# Patient Record
Sex: Male | Born: 1985 | Race: White | Hispanic: No | Marital: Single | Smoking: Never smoker
Health system: Southern US, Community
[De-identification: ages and names within clinical notes are randomized; demographics above are authoritative.]

## PROBLEM LIST (undated history)

## (undated) DIAGNOSIS — K635 Polyp of colon: Secondary | ICD-10-CM

## (undated) DIAGNOSIS — R39198 Other difficulties with micturition: Secondary | ICD-10-CM

## (undated) DIAGNOSIS — K219 Gastro-esophageal reflux disease without esophagitis: Secondary | ICD-10-CM

## (undated) DIAGNOSIS — T4145XA Adverse effect of unspecified anesthetic, initial encounter: Secondary | ICD-10-CM

## (undated) DIAGNOSIS — F988 Other specified behavioral and emotional disorders with onset usually occurring in childhood and adolescence: Secondary | ICD-10-CM

## (undated) DIAGNOSIS — R110 Nausea: Secondary | ICD-10-CM

## (undated) DIAGNOSIS — T8859XA Other complications of anesthesia, initial encounter: Secondary | ICD-10-CM

## (undated) DIAGNOSIS — R5383 Other fatigue: Secondary | ICD-10-CM

## (undated) DIAGNOSIS — R531 Weakness: Secondary | ICD-10-CM

## (undated) HISTORY — DX: Other fatigue: R53.83

## (undated) HISTORY — DX: Nausea: R11.0

## (undated) HISTORY — DX: Polyp of colon: K63.5

## (undated) HISTORY — DX: Weakness: R53.1

## (undated) HISTORY — DX: Gastro-esophageal reflux disease without esophagitis: K21.9

## (undated) HISTORY — DX: Other specified behavioral and emotional disorders with onset usually occurring in childhood and adolescence: F98.8

## (undated) HISTORY — PX: UPPER GASTROINTESTINAL ENDOSCOPY: SHX188

## (undated) HISTORY — DX: Other difficulties with micturition: R39.198

---
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
## 2007-04-03 ENCOUNTER — Ambulatory Visit: Payer: Self-pay | Admitting: Family Medicine

## 2007-10-02 ENCOUNTER — Ambulatory Visit: Payer: Self-pay | Admitting: Family Medicine

## 2007-10-14 ENCOUNTER — Ambulatory Visit: Payer: Self-pay | Admitting: Family Medicine

## 2008-06-21 ENCOUNTER — Ambulatory Visit: Payer: Self-pay | Admitting: Family Medicine

## 2008-06-23 ENCOUNTER — Encounter: Admission: RE | Admit: 2008-06-23 | Discharge: 2008-06-23 | Payer: Self-pay | Admitting: Family Medicine

## 2008-06-30 ENCOUNTER — Encounter: Admission: RE | Admit: 2008-06-30 | Discharge: 2008-06-30 | Payer: Self-pay | Admitting: Family Medicine

## 2008-07-21 ENCOUNTER — Ambulatory Visit (HOSPITAL_COMMUNITY): Admission: RE | Admit: 2008-07-21 | Discharge: 2008-07-21 | Payer: Self-pay | Admitting: Gastroenterology

## 2008-11-22 ENCOUNTER — Ambulatory Visit: Payer: Self-pay | Admitting: Family Medicine

## 2009-01-06 ENCOUNTER — Ambulatory Visit: Payer: Self-pay | Admitting: Family Medicine

## 2009-08-25 ENCOUNTER — Emergency Department (HOSPITAL_COMMUNITY): Admission: EM | Admit: 2009-08-25 | Discharge: 2009-08-25 | Payer: Self-pay | Admitting: Emergency Medicine

## 2009-09-20 ENCOUNTER — Ambulatory Visit: Payer: Self-pay | Admitting: Family Medicine

## 2010-02-21 ENCOUNTER — Ambulatory Visit (HOSPITAL_COMMUNITY): Admission: RE | Admit: 2010-02-21 | Discharge: 2010-02-21 | Payer: Self-pay | Admitting: Orthopedic Surgery

## 2010-06-04 HISTORY — PX: CHOLECYSTECTOMY: SHX55

## 2011-01-25 ENCOUNTER — Other Ambulatory Visit (HOSPITAL_COMMUNITY): Payer: Self-pay | Admitting: Gastroenterology

## 2011-01-25 DIAGNOSIS — R109 Unspecified abdominal pain: Secondary | ICD-10-CM

## 2011-01-25 DIAGNOSIS — R110 Nausea: Secondary | ICD-10-CM

## 2011-02-08 ENCOUNTER — Encounter: Payer: Self-pay | Admitting: Family Medicine

## 2011-02-14 ENCOUNTER — Encounter (HOSPITAL_COMMUNITY)
Admission: RE | Admit: 2011-02-14 | Discharge: 2011-02-14 | Disposition: A | Discharge status: Home / Self Care | Payer: BC Managed Care – PPO | Source: Ambulatory Visit | Attending: Gastroenterology | Admitting: Gastroenterology

## 2011-02-14 ENCOUNTER — Encounter (HOSPITAL_COMMUNITY): Payer: Self-pay

## 2011-02-14 DIAGNOSIS — R1011 Right upper quadrant pain: Secondary | ICD-10-CM | POA: Insufficient documentation

## 2011-02-14 DIAGNOSIS — R109 Unspecified abdominal pain: Secondary | ICD-10-CM

## 2011-02-14 DIAGNOSIS — R110 Nausea: Secondary | ICD-10-CM

## 2011-02-14 MED ORDER — TECHNETIUM TC 99M MEBROFENIN IV KIT
5.4000 | PACK | Freq: Once | INTRAVENOUS | Status: AC | PRN
  Administered 2011-02-14: 5.4 via INTRAVENOUS

## 2011-03-20 ENCOUNTER — Encounter (INDEPENDENT_AMBULATORY_CARE_PROVIDER_SITE_OTHER): Payer: Self-pay | Admitting: General Surgery

## 2011-03-20 ENCOUNTER — Ambulatory Visit (INDEPENDENT_AMBULATORY_CARE_PROVIDER_SITE_OTHER): Payer: BC Managed Care – PPO | Admitting: General Surgery

## 2011-03-20 ENCOUNTER — Other Ambulatory Visit (INDEPENDENT_AMBULATORY_CARE_PROVIDER_SITE_OTHER): Payer: Self-pay | Admitting: General Surgery

## 2011-03-20 VITALS — BP 120/70 | HR 60 | Temp 97.9°F | Resp 20 | Ht 78.0 in | Wt 233.5 lb

## 2011-03-20 DIAGNOSIS — R1011 Right upper quadrant pain: Secondary | ICD-10-CM

## 2011-03-20 DIAGNOSIS — R112 Nausea with vomiting, unspecified: Secondary | ICD-10-CM

## 2011-03-20 NOTE — Progress Notes (Signed)
Chief Complaint  Patient presents with  . Other    new pt eval of RUQ pain with nausea possibly GB     HPI Luis Walton is a 25 y.o. male.   HPI He is referred by Dr. Loreta Walton for evaluation of right upper quadrant pain and nausea. These symptoms have been present off and on for 5 years. They are getting worse. He has had extensive evaluations for his symptoms in the past. He has seen multiple physicians. Recently he saw Dr. Loreta Walton and an abdominal ultrasound was obtained. This demonstrated normal gallbladder with no gallstones. A nuclear medicine hepatobiliary scan was obtained and demonstrated a normal  gallbladder function with ejection fraction of 71.4% (normal being greater than 30%).  A previous upper endoscopy was unremarkable. The symptoms do not seem to be related to food. They did not seem to be related to specific activities. He tried multiple medications without significant relief. He is here with his mother to discuss possible gallbladder surgery.  Past Medical History  Diagnosis Date  . ADD (attention deficit disorder)   . Colonic polyp   . Abdominal pain   . GERD (gastroesophageal reflux disease)   . Nausea   . Difficulty urinating   . Fatigue   . Weakness generalized     aching in joints and weakness after syncope     Past Surgical History  Procedure Date  . Upper gastrointestinal endoscopy     Family History  Problem Relation Age of Onset  . Asthma Brother   . Asthma Maternal Uncle   . Asthma Paternal Aunt   . Heart disease Paternal Grandmother   . Heart disease Paternal Grandfather     Social History History  Substance Use Topics  . Smoking status: Never Smoker   . Smokeless tobacco: Never Used  . Alcohol Use: No    No Known Allergies  No current outpatient prescriptions on file.    Review of Systems Review of Systems  Constitutional: Negative for fever and chills.  Respiratory: Negative.   Cardiovascular: Negative.   Gastrointestinal: Positive  for abdominal pain.  Genitourinary: Positive for difficulty urinating.  Hematological: Negative.     Blood pressure 120/70, pulse 60, temperature 97.9 F (36.6 C), resp. rate 20, height 6\' 6"  (1.981 m), weight 233 lb 8 oz (105.915 kg).  Physical Exam Physical Exam  Constitutional: He appears well-developed and well-nourished. No distress.  HENT:  Head: Normocephalic and atraumatic.  Eyes: Conjunctivae and EOM are normal.  Abdominal: Soft. Bowel sounds are normal. He exhibits no distension and no mass. There is tenderness (mild in RUQ). There is no guarding.  Musculoskeletal: Normal range of motion. He exhibits no edema.  Skin: Skin is warm and dry.       No jaundice.    Data Reviewed Notes from Dr. Kenna Walton office.  Assessment      Right upper quadrant pain with nausea. No obvious exacerbating factors. Symptoms are getting progressively worse. Upper endoscopy is unremarkable. Objective gallbladder studies are within normal limits. No evidence of irritable bowel syndrome.  Despite normal gallbladder studies, his symptoms are somewhat suggestive of gallbladder disease. Of note is that he has not had a gastric emptying scan.  Plan    Obtain gastric emptying scan. If this is normal then we discussed the possibility of laparoscopic cholecystectomy.  I explained to him and his mother that I could not tell them if it was going to definitely help his symptoms or not.  The operation could be  diagnostic in that he may still have his symptoms after the gallbladder removed and thus it is not his gallbladder causing symptoms. The operation could possibly be diagnostic and therapeutic in that his symptoms would improve.  I have explained the procedure, risks, and aftercare of cholecystectomy.  Risks include but are not limited to bleeding, infection, wound problems, anesthesia, diarrhea, bile leak, injury to common bile duct/liver/intestine.  He seems to understand.  As for now, we will await the  results of the gastric emptying scan. I will discuss those results with him and further treatment as well.        Luis Walton J 03/20/2011, 10:33 PM

## 2011-03-20 NOTE — Patient Instructions (Signed)
We will call you with the results of your gastric emptying scan.

## 2011-04-04 ENCOUNTER — Encounter (INDEPENDENT_AMBULATORY_CARE_PROVIDER_SITE_OTHER): Payer: Self-pay

## 2011-04-04 ENCOUNTER — Encounter (HOSPITAL_COMMUNITY)
Admission: RE | Admit: 2011-04-04 | Discharge: 2011-04-04 | Disposition: A | Discharge status: Home / Self Care | Payer: BC Managed Care – PPO | Source: Ambulatory Visit | Attending: General Surgery | Admitting: General Surgery

## 2011-04-04 DIAGNOSIS — R112 Nausea with vomiting, unspecified: Secondary | ICD-10-CM

## 2011-04-04 DIAGNOSIS — R1011 Right upper quadrant pain: Secondary | ICD-10-CM

## 2011-04-04 MED ORDER — TECHNETIUM TC 99M SULFUR COLLOID
2.0000 | Freq: Once | INTRAVENOUS | Status: AC | PRN
  Administered 2011-04-04: 2 via ORAL

## 2011-04-05 ENCOUNTER — Telehealth (INDEPENDENT_AMBULATORY_CARE_PROVIDER_SITE_OTHER): Payer: Self-pay | Admitting: General Surgery

## 2011-04-05 NOTE — Telephone Encounter (Signed)
Tried to call Luis Walton, but was unable to reach him.  I did speak with his mother. His gastric emptying scan is normal.  They are interested in proceeding with cholecystectomy and we will work on getting this scheduled.

## 2011-04-12 ENCOUNTER — Other Ambulatory Visit (INDEPENDENT_AMBULATORY_CARE_PROVIDER_SITE_OTHER): Payer: Self-pay | Admitting: General Surgery

## 2011-04-16 ENCOUNTER — Other Ambulatory Visit (INDEPENDENT_AMBULATORY_CARE_PROVIDER_SITE_OTHER): Payer: Self-pay | Admitting: General Surgery

## 2011-04-27 ENCOUNTER — Encounter (HOSPITAL_COMMUNITY): Payer: Self-pay

## 2011-04-30 ENCOUNTER — Encounter (HOSPITAL_COMMUNITY): Payer: Self-pay

## 2011-04-30 ENCOUNTER — Encounter (HOSPITAL_COMMUNITY)
Admission: RE | Admit: 2011-04-30 | Discharge: 2011-04-30 | Disposition: A | Discharge status: Home / Self Care | Payer: BC Managed Care – PPO | Source: Ambulatory Visit | Attending: General Surgery | Admitting: General Surgery

## 2011-04-30 LAB — COMPREHENSIVE METABOLIC PANEL
ALT: 26 U/L (ref 0–53)
AST: 18 U/L (ref 0–37)
Alkaline Phosphatase: 86 U/L (ref 39–117)
CO2: 29 mEq/L (ref 19–32)
Calcium: 10 mg/dL (ref 8.4–10.5)
Chloride: 104 mEq/L (ref 96–112)
GFR calc Af Amer: >90 mL/min (ref >90)
GFR calc non Af Amer: >90 mL/min (ref >90)
Glucose, Bld: 99 mg/dL (ref 70–99)
Sodium: 143 mEq/L (ref 135–145)
Total Bilirubin: 0.4 mg/dL (ref 0.3–1.2)

## 2011-04-30 LAB — CBC
Hemoglobin: 17.2 g/dL — ABNORMAL HIGH (ref 13.0–17.0)
MCH: 31.5 pg (ref 26.0–34.0)
Platelets: 123 10*3/uL — ABNORMAL LOW (ref 150–400)
RBC: 5.46 MIL/uL (ref 4.22–5.81)
WBC: 8.3 10*3/uL (ref 4.0–10.5)

## 2011-04-30 LAB — SURGICAL PCR SCREEN: MRSA, PCR: NEGATIVE

## 2011-04-30 LAB — DIFFERENTIAL
Basophils Absolute: 0.1 10*3/uL (ref 0.0–0.1)
Basophils Relative: 1 % (ref 0–1)
Eosinophils Absolute: 0.3 10*3/uL (ref 0.0–0.7)
Eosinophils Relative: 3 % (ref 0–5)
Lymphocytes Relative: 29 % (ref 12–46)
Monocytes Absolute: 0.7 10*3/uL (ref 0.1–1.0)

## 2011-04-30 NOTE — Pre-Procedure Instructions (Signed)
22 Luis Walton  04/30/2011   Your procedure is scheduled on: 05/04/11  Report to [id number] AM.  Call this number if you have problems the morning of surgery: [phone]   Remember:   Do not eat food:After Midnight.  May have clear liquids: up to 4 Hours before arrival.  Clear liquids include soda, tea, black coffee, apple or grape juice, broth.  Take these medicines the morning of surgery with A SIP OF WATER: none              Discontinue any asa, blood thinners, herbal meds 5 days prior to surgery  Do not wear jewelry, make-up or nail polish.  Do not wear lotions, powders, or perfumes. You may wear deodorant.  Do not shave 48 hours prior to surgery.  Do not bring valuables to the hospital.  Contacts, dentures or bridgework may not be worn into surgery.  Leave suitcase in the car. After surgery it may be brought to your room.  For patients admitted to the hospital, checkout time is 11:00 AM the day of discharge.   Patients discharged the day of surgery will not be allowed to drive home.  Name and phone number of your driver:mom [name]  [phone]  Special Instructions: CHG Shower Use Special Wash: 1/2 bottle night before surgery and 1/2 bottle morning of surgery.   Please read over the following fact sheets that you were given: Pain Booklet, Coughing and Deep Breathing, MRSA Information and Surgical Site Infection Prevention

## 2011-04-30 NOTE — Progress Notes (Signed)
Chart reviewed. Left for allison zelenak anesthesia to review platelets

## 2011-05-01 NOTE — Consult Note (Signed)
Anesthesia:  25 year old male for lap chole.  Hx + for GERD and ADD.  I was asked to review preoperative labs showing PLT count of 123K.  This is acceptable from an anesthesia standpoint.

## 2011-05-03 MED ORDER — CEFAZOLIN SODIUM-DEXTROSE 2-3 GM-% IV SOLR
2.0000 g | INTRAVENOUS | Status: DC
  Filled 2011-05-03: qty 50

## 2011-05-04 ENCOUNTER — Encounter (HOSPITAL_COMMUNITY): Payer: Self-pay | Admitting: *Deleted

## 2011-05-04 ENCOUNTER — Ambulatory Visit (HOSPITAL_COMMUNITY): Payer: BC Managed Care – PPO | Admitting: Vascular Surgery

## 2011-05-04 ENCOUNTER — Other Ambulatory Visit (INDEPENDENT_AMBULATORY_CARE_PROVIDER_SITE_OTHER): Payer: Self-pay | Admitting: General Surgery

## 2011-05-04 ENCOUNTER — Ambulatory Visit (HOSPITAL_COMMUNITY)
Admission: RE | Admit: 2011-05-04 | Discharge: 2011-05-04 | Disposition: A | Discharge status: Home / Self Care | Payer: BC Managed Care – PPO | Source: Ambulatory Visit | Attending: General Surgery | Admitting: General Surgery

## 2011-05-04 ENCOUNTER — Encounter (HOSPITAL_COMMUNITY)
Admission: RE | Disposition: A | Discharge status: Home / Self Care | Payer: Self-pay | Source: Ambulatory Visit | Attending: General Surgery

## 2011-05-04 ENCOUNTER — Encounter (HOSPITAL_COMMUNITY): Payer: Self-pay | Admitting: Vascular Surgery

## 2011-05-04 ENCOUNTER — Ambulatory Visit (HOSPITAL_COMMUNITY): Payer: BC Managed Care – PPO

## 2011-05-04 DIAGNOSIS — Z01812 Encounter for preprocedural laboratory examination: Secondary | ICD-10-CM | POA: Insufficient documentation

## 2011-05-04 DIAGNOSIS — K811 Chronic cholecystitis: Secondary | ICD-10-CM

## 2011-05-04 DIAGNOSIS — K219 Gastro-esophageal reflux disease without esophagitis: Secondary | ICD-10-CM | POA: Insufficient documentation

## 2011-05-04 HISTORY — DX: Adverse effect of unspecified anesthetic, initial encounter: T41.45XA

## 2011-05-04 HISTORY — DX: Other complications of anesthesia, initial encounter: T88.59XA

## 2011-05-04 HISTORY — PX: CHOLECYSTECTOMY: SHX55

## 2011-05-04 SURGERY — LAPAROSCOPIC CHOLECYSTECTOMY WITH INTRAOPERATIVE CHOLANGIOGRAM
Anesthesia: General | Site: Abdomen | Wound class: Clean Contaminated

## 2011-05-04 MED ORDER — ACETAMINOPHEN 650 MG RE SUPP: 650.0000 mg | RECTAL | Status: DC | PRN

## 2011-05-04 MED ORDER — OXYCODONE HCL 5 MG PO TABS: 5.0000 mg | ORAL_TABLET | ORAL | Status: DC | PRN

## 2011-05-04 MED ORDER — ROCURONIUM BROMIDE 100 MG/10ML IV SOLN
INTRAVENOUS | Status: DC | PRN
  Administered 2011-05-04: 50 mg via INTRAVENOUS

## 2011-05-04 MED ORDER — OXYCODONE-ACETAMINOPHEN 5-325 MG PO TABS: 1.0000 | ORAL_TABLET | ORAL | Status: AC | PRN

## 2011-05-04 MED ORDER — IOHEXOL 300 MG/ML  SOLN
INTRAMUSCULAR | Status: DC | PRN
  Administered 2011-05-04: 2 mL

## 2011-05-04 MED ORDER — MIDAZOLAM HCL 5 MG/5ML IJ SOLN
INTRAMUSCULAR | Status: DC | PRN
  Administered 2011-05-04: 3 mg via INTRAVENOUS

## 2011-05-04 MED ORDER — SODIUM CHLORIDE 0.9 % IR SOLN
Status: DC | PRN
  Administered 2011-05-04: 1000 mL

## 2011-05-04 MED ORDER — HYDROMORPHONE HCL PF 1 MG/ML IJ SOLN
0.2500 mg | INTRAMUSCULAR | Status: DC | PRN
  Administered 2011-05-04: 0.5 mg via INTRAVENOUS

## 2011-05-04 MED ORDER — LACTATED RINGERS IV SOLN
INTRAVENOUS | Status: DC | PRN
  Administered 2011-05-04 (×2): via INTRAVENOUS

## 2011-05-04 MED ORDER — LACTATED RINGERS IV SOLN
INTRAVENOUS | Status: DC
  Administered 2011-05-04: 14:00:00 via INTRAVENOUS

## 2011-05-04 MED ORDER — DEXTROSE IN LACTATED RINGERS 5 % IV SOLN: INTRAVENOUS | Status: DC

## 2011-05-04 MED ORDER — MORPHINE SULFATE 2 MG/ML IJ SOLN: 2.0000 mg | INTRAMUSCULAR | Status: DC | PRN

## 2011-05-04 MED ORDER — GLYCOPYRROLATE 0.2 MG/ML IJ SOLN
INTRAMUSCULAR | Status: DC | PRN
  Administered 2011-05-04: .4 mg via INTRAVENOUS

## 2011-05-04 MED ORDER — SODIUM CHLORIDE 0.9 % IJ SOLN: 3.0000 mL | INTRAMUSCULAR | Status: DC | PRN

## 2011-05-04 MED ORDER — ONDANSETRON HCL 4 MG/2ML IJ SOLN: 4.0000 mg | Freq: Four times a day (QID) | INTRAMUSCULAR | Status: DC | PRN

## 2011-05-04 MED ORDER — CEFAZOLIN SODIUM 1-5 GM-% IV SOLN
INTRAVENOUS | Status: DC | PRN
  Administered 2011-05-04: 2 g via INTRAVENOUS

## 2011-05-04 MED ORDER — SODIUM CHLORIDE 0.9 % IJ SOLN: 3.0000 mL | Freq: Two times a day (BID) | INTRAMUSCULAR | Status: DC

## 2011-05-04 MED ORDER — ACETAMINOPHEN 325 MG PO TABS: 650.0000 mg | ORAL_TABLET | ORAL | Status: DC | PRN

## 2011-05-04 MED ORDER — BUPIVACAINE-EPINEPHRINE 0.25% -1:200000 IJ SOLN
INTRAMUSCULAR | Status: DC | PRN
  Administered 2011-05-04: 10 mL

## 2011-05-04 MED ORDER — DEXAMETHASONE SODIUM PHOSPHATE 4 MG/ML IJ SOLN
INTRAMUSCULAR | Status: DC | PRN
  Administered 2011-05-04: 8 mg via INTRAVENOUS

## 2011-05-04 MED ORDER — SODIUM CHLORIDE 0.9 % IV SOLN: 250.0000 mL | INTRAVENOUS | Status: DC | PRN

## 2011-05-04 MED ORDER — FENTANYL CITRATE 0.05 MG/ML IJ SOLN
INTRAMUSCULAR | Status: DC | PRN
  Administered 2011-05-04: 50 ug via INTRAVENOUS
  Administered 2011-05-04 (×4): 100 ug via INTRAVENOUS
  Administered 2011-05-04: 50 ug via INTRAVENOUS

## 2011-05-04 MED ORDER — ONDANSETRON HCL 4 MG/2ML IJ SOLN
INTRAMUSCULAR | Status: DC | PRN
  Administered 2011-05-04: 4 mg via INTRAVENOUS

## 2011-05-04 MED ORDER — ONDANSETRON HCL 4 MG/2ML IJ SOLN: 4.0000 mg | Freq: Once | INTRAMUSCULAR | Status: DC | PRN

## 2011-05-04 MED ORDER — PROPOFOL 10 MG/ML IV EMUL
INTRAVENOUS | Status: DC | PRN
  Administered 2011-05-04: 300 mg via INTRAVENOUS

## 2011-05-04 MED ORDER — NEOSTIGMINE METHYLSULFATE 1 MG/ML IJ SOLN
INTRAMUSCULAR | Status: DC | PRN
  Administered 2011-05-04: 3 mg via INTRAVENOUS

## 2011-05-04 MED ORDER — PROMETHAZINE HCL 25 MG/ML IJ SOLN: 12.5000 mg | Freq: Four times a day (QID) | INTRAMUSCULAR | Status: DC | PRN

## 2011-05-04 SURGICAL SUPPLY — 48 items
APL SKNCLS STERI-STRIP NONHPOA (GAUZE/BANDAGES/DRESSINGS) ×1
APPLIER CLIP 5 13 M/L LIGAMAX5 (MISCELLANEOUS) ×2
APR CLP MED LRG 5 ANG JAW (MISCELLANEOUS) ×1
BAG SPEC RTRVL LRG 6X4 10 (ENDOMECHANICALS) ×1
BENZOIN TINCTURE PRP APPL 2/3 (GAUZE/BANDAGES/DRESSINGS) ×2 IMPLANT
CANISTER SUCTION 2500CC (MISCELLANEOUS) ×2 IMPLANT
CHLORAPREP W/TINT 26ML (MISCELLANEOUS) ×2 IMPLANT
CLIP APPLIE 5 13 M/L LIGAMAX5 (MISCELLANEOUS) ×1 IMPLANT
CLOTH BEACON ORANGE TIMEOUT ST (SAFETY) ×2 IMPLANT
COVER MAYO STAND STRL (DRAPES) ×2 IMPLANT
COVER SURGICAL LIGHT HANDLE (MISCELLANEOUS) ×2 IMPLANT
DECANTER SPIKE VIAL GLASS SM (MISCELLANEOUS) ×4 IMPLANT
DRAPE C-ARM 42X72 X-RAY (DRAPES) ×2 IMPLANT
DRAPE UTILITY 15X26 W/TAPE STR (DRAPE) ×4 IMPLANT
DRSG TEGADERM 2.38X2.75 (GAUZE/BANDAGES/DRESSINGS) ×1 IMPLANT
ELECT REM PT RETURN 9FT ADLT (ELECTROSURGICAL) ×2
ELECTRODE REM PT RTRN 9FT ADLT (ELECTROSURGICAL) ×1 IMPLANT
GAUZE SPONGE 2X2 8PLY STRL LF (GAUZE/BANDAGES/DRESSINGS) ×1 IMPLANT
GLOVE BIO SURGEON STRL SZ7.5 (GLOVE) ×1 IMPLANT
GLOVE BIOGEL PI IND STRL 6.5 (GLOVE) IMPLANT
GLOVE BIOGEL PI IND STRL 7.0 (GLOVE) IMPLANT
GLOVE BIOGEL PI IND STRL 8 (GLOVE) ×1 IMPLANT
GLOVE BIOGEL PI INDICATOR 6.5 (GLOVE) ×1
GLOVE BIOGEL PI INDICATOR 7.0 (GLOVE) ×3
GLOVE BIOGEL PI INDICATOR 8 (GLOVE) ×1
GLOVE ECLIPSE 8.0 STRL XLNG CF (GLOVE) ×2 IMPLANT
GLOVE SURG ORTHO 7.0 STRL STRW (GLOVE) ×1 IMPLANT
GLOVE SURG SS PI 6.5 STRL IVOR (GLOVE) ×2 IMPLANT
GOWN STRL NON-REIN LRG LVL3 (GOWN DISPOSABLE) ×9 IMPLANT
KIT BASIN OR (CUSTOM PROCEDURE TRAY) ×2 IMPLANT
KIT ROOM TURNOVER OR (KITS) ×2 IMPLANT
NS IRRIG 1000ML POUR BTL (IV SOLUTION) ×2 IMPLANT
PAD ARMBOARD 7.5X6 YLW CONV (MISCELLANEOUS) ×3 IMPLANT
POUCH SPECIMEN RETRIEVAL 10MM (ENDOMECHANICALS) ×2 IMPLANT
SCISSORS LAP 5X35 DISP (ENDOMECHANICALS) IMPLANT
SET CHOLANGIOGRAPH 5 50 .035 (SET/KITS/TRAYS/PACK) ×2 IMPLANT
SET IRRIG TUBING LAPAROSCOPIC (IRRIGATION / IRRIGATOR) ×2 IMPLANT
SLEEVE ENDOPATH XCEL 5M (ENDOMECHANICALS) ×4 IMPLANT
SPECIMEN JAR SMALL (MISCELLANEOUS) ×2 IMPLANT
SPONGE GAUZE 2X2 STER 10/PKG (GAUZE/BANDAGES/DRESSINGS) ×1
STRIP CLOSURE SKIN 1/2X4 (GAUZE/BANDAGES/DRESSINGS) ×1 IMPLANT
SUT MON AB 4-0 PC3 18 (SUTURE) ×2 IMPLANT
TOWEL OR 17X24 6PK STRL BLUE (TOWEL DISPOSABLE) ×2 IMPLANT
TOWEL OR 17X26 10 PK STRL BLUE (TOWEL DISPOSABLE) ×2 IMPLANT
TRAY LAPAROSCOPIC (CUSTOM PROCEDURE TRAY) ×2 IMPLANT
TROCAR XCEL BLUNT TIP 100MML (ENDOMECHANICALS) ×2 IMPLANT
TROCAR XCEL NON-BLD 11X100MML (ENDOMECHANICALS) IMPLANT
TROCAR XCEL NON-BLD 5MMX100MML (ENDOMECHANICALS) ×2 IMPLANT

## 2011-05-04 NOTE — Interval H&P Note (Signed)
History and Physical Interval Note:  05/04/2011 2:31 PM  Luis Walton  has presented today for surgery, with the diagnosis of Chronic cholecystitis  The various methods of treatment have been discussed with the patient and family. After consideration of risks, benefits and other options for treatment, the patient has consented to  Procedure(s): LAPAROSCOPIC CHOLECYSTECTOMY WITH INTRAOPERATIVE CHOLANGIOGRAM as a surgical intervention .  The patients' history has been reviewed, patient examined, no change in status, stable for surgery.  I have reviewed the patients' chart and labs.  Questions were answered to the patient's satisfaction.     Dally Oshel Shela Commons

## 2011-05-04 NOTE — Anesthesia Preprocedure Evaluation (Addendum)
Anesthesia Evaluation  Patient identified by MRN, date of birth, ID band Patient awake    Reviewed: Allergy & Precautions, H&P , NPO status , Patient's Chart, lab work & pertinent test results  History of Anesthesia Complications Negative for: history of anesthetic complications  Airway Mallampati: I TM Distance: >3 FB Neck ROM: full    Dental  (+) Teeth Intact   Pulmonary    + wheezing      Cardiovascular neg cardio ROS regular Normal    Neuro/Psych PSYCHIATRIC DISORDERS add   GI/Hepatic Neg liver ROS, GERD-  Poorly Controlled,  Endo/Other  Negative Endocrine ROS  Renal/GU negative Renal ROS  Genitourinary negative   Musculoskeletal   Abdominal   Peds  Hematology negative hematology ROS (+)   Anesthesia Other Findings   Reproductive/Obstetrics                          Anesthesia Physical Anesthesia Plan  ASA: I  Anesthesia Plan: General   Post-op Pain Management:    Induction: Intravenous  Airway Management Planned: Oral ETT  Additional Equipment:   Intra-op Plan:   Post-operative Plan: Extubation in OR  Informed Consent: I have reviewed the patients History and Physical, chart, labs and discussed the procedure including the risks, benefits and alternatives for the proposed anesthesia with the patient or authorized representative who has indicated his/her understanding and acceptance.   Dental advisory given  Plan Discussed with: Anesthesiologist, CRNA and Surgeon  Anesthesia Plan Comments:        Anesthesia Quick Evaluation

## 2011-05-04 NOTE — Transfer of Care (Signed)
Immediate Anesthesia Transfer of Care Note  Patient: Luis Walton  Procedure(s) Performed:  LAPAROSCOPIC CHOLECYSTECTOMY WITH INTRAOPERATIVE CHOLANGIOGRAM  Patient Location: PACU  Anesthesia Type: General  Level of Consciousness: awake, alert , oriented and patient cooperative  Airway & Oxygen Therapy: Patient Spontanous Breathing  Post-op Assessment: Report given to PACU RN, Post -op Vital signs reviewed and stable and Patient moving all extremities  Post vital signs: Reviewed and stable  Complications: No apparent anesthesia complications

## 2011-05-04 NOTE — Anesthesia Postprocedure Evaluation (Signed)
  Anesthesia Post-op Note  Patient: Luis Walton  Procedure(s) Performed:  LAPAROSCOPIC CHOLECYSTECTOMY WITH INTRAOPERATIVE CHOLANGIOGRAM  Patient Location: PACU  Anesthesia Type: General  Level of Consciousness: awake, alert , oriented and patient cooperative  Airway and Oxygen Therapy: Patient Spontanous Breathing  Post-op Pain: mild  Post-op Assessment: Post-op Vital signs reviewed, Patient's Cardiovascular Status Stable, Respiratory Function Stable, Patent Airway, No signs of Nausea or vomiting and Pain level controlled  Post-op Vital Signs: stable  Complications: No apparent anesthesia complications

## 2011-05-04 NOTE — Op Note (Signed)
Preoperative diagnosis:  Chronic cholecystitis  Postoperative diagnosis:  Same  Procedure: Laparoscopic cholecystectomy with cholangiogram.  Surgeon: Avel Peace, M.D.  Asst.:  Chevis Pretty, M.D.  Anesthesia: Gen.  Indication:   Luis Walton is a 25 year old male with a long history of right upper quadrant discomfort and nausea. He has had extensive evaluations in the past. No specific etiology has been found. He has had a recent ultrasound, hepatobiliary scan, an upper endoscopy. No etiology for the pain has been noted. The gallbladder has been normal on all of these tests. His symptoms are suggestive of biliary colic. We discussed a laparoscopic cholecystectomy. He was informed that could be diagnostic (his symptoms would not be relieved) or diagnostic and therapeutic (his symptoms would be relieved).  There is no way to predict success rate of the operation at this time and he understands that. He, however, would like to proceed and thus presents for the operation.  Technique: The patient was brought to the operating room, placed supine on the operating table, and a general anesthetic was administered. The hair on the abdominal wall was clipped as was necessary. The abdominal wall was then sterilely prepped and draped. Local anesthetic (Marcaine) was infiltrated in the subumbilical region. A small subumbilical incision was made through the skin, subcutaneous tissue, fascia, and peritoneum entering the peritoneal cavity under direct vision. A pursestring suture of 0 Vicryl was placed around the edges of the fascia. A Hassan trocar was introduced into the peritoneal cavity and a pneumoperitoneum was created by insufflation of carbon dioxide gas. The laparoscope was introduced into the trocar and no underlying bleeding or organ injury was noted. The patient was then placed in the reverse Trendelenburg position with the right side tilted slightly up.  Three more trochars were then placed into the  abdominal cavity under laparoscopic vision. One in the epigastric area, and 2 in the right upper quadrant area. The gallbladder was visualized.  The gallbladder was abnormal in appearance. There was gross evidence of chronic inflammatory changes and dense adhesions between the omentum and the gallbladder. These these were separated from the gallbladder using blunt dissection and selective electrocautery.  Then, the fundus was grasped and retracted toward the right shoulder.  The infundibulum was mobilized with dissection close to the gallbladder and retracted laterally. The cystic duct was identified and a window was created around it. It was of small caliber.  The cystic artery was also identified and a window was created around it. The critical view was achieved. A clip was placed at the neck of the gallbladder. A small incision was made in the cystic duct. A cholangiocatheter was introduced through the anterior abdominal wall and placed in the cystic duct. A intraoperative cholangiogram was then performed.  Under real-time fluoroscopy, dilute contrast was injected into the cystic duct.  The common hepatic duct, the right and left hepatic ducts, and the common duct were all visualized. Contrast drained into the duodenum without obvious evidence of any obstructing ductal lesion. The final report is pending the Radiologist's interpretation.  The cholangiocatheter was removed, the cystic duct was clipped 3 times on the biliary side, and then the cystic duct was divided sharply. No bile leak was noted from the cystic duct stump.  The cystic artery was then clipped and divided. Following this the gallbladder was dissected free from the liver using electrocautery. The gallbladder was then placed in a retrieval bag and removed from the abdominal cavity through the subumbilical incision.  The gallbladder fossa was inspected,  irrigated, and bleeding was controlled with electrocautery. Inspection showed that  hemostasis was adequate and there was no evidence of bile leak.  The irrigation fluid was evacuated as much as possible.  The subumbilical trocar was removed and the fascial defect was closed by tightening and tying down the pursestring suture under laparoscopic vision.  The remaining trochars were removed and the pneumoperitoneum was released. The skin incisions were closed with 4-0 Monocryl subcuticular stitches. Steri-Strips and sterile dressings were applied.  The procedure was well-tolerated without any apparent complications. The patient was taken to the recovery room in satisfactory condition.

## 2011-05-04 NOTE — H&P (Signed)
Luis Walton is an 25 y.o. male.   Chief Complaint: RUQ abdominal pain HPI:   He has had RUQ abdominal pain with nausea for many years.  His workup has been negative to date and is documented in my 03/20/11 note.  His sxs are somewhat consistent with biliary colic.  We discussed doing a laparoscopic cholecystectomy, and he understands this may be diagnostic but not therapeutic.  He presents today for the operation.  Past Medical History  Diagnosis Date  . Colonic polyp   . Abdominal pain   . Nausea   . Difficulty urinating   . Fatigue   . Weakness generalized     aching in joints and weakness after syncope   . GERD (gastroesophageal reflux disease)   . ADD (attention deficit disorder)   . Complication of anesthesia     Pt states the last time he was put to sleep, it took a lots of meds to put him to sleep.    Past Surgical History  Procedure Date  . Upper gastrointestinal endoscopy     Family History  Problem Relation Age of Onset  . Asthma Brother   . Asthma Maternal Uncle   . Asthma Paternal Aunt   . Heart disease Paternal Grandmother   . Heart disease Paternal Grandfather    Social History:  reports that he has never smoked. He has never used smokeless tobacco. He reports that he uses illicit drugs (Marijuana) about 7 times per week. He reports that he does not drink alcohol.  Allergies: No Known Allergies  Medications Prior to Admission  Medication Dose Route Frequency Provider Last Rate Last Dose  . bupivacaine-EPINEPHrine (MARCAINE W/ EPI) 0.25 % (with pres) injection    PRN Adolph Pollack, MD   1 mL at 05/04/11 1309  . ceFAZolin (ANCEF) IVPB 2 g/50 mL premix  2 g Intravenous 60 min Pre-Op Garth Bigness Frens, PHARMD      . iohexol (OMNIPAQUE) 300 MG/ML injection    PRN Adolph Pollack, MD   1 mL at 05/04/11 1310  . lactated ringers infusion   Intravenous Continuous Rivka Barbara, MD 50 mL/hr at 05/04/11 1340    . sodium chloride irrigation 0.9 %    PRN  Adolph Pollack, MD   1,000 mL at 05/04/11 1308  . sodium chloride irrigation 0.9 %    PRN Adolph Pollack, MD   1,000 mL at 05/04/11 1309   No current outpatient prescriptions on file as of 05/04/2011.    No results found for this or any previous visit (from the past 48 hour(s)). No results found.  Review of Systems  Constitutional: Negative for fever and chills.  HENT: Negative for congestion and sore throat.   Respiratory: Negative for cough.   Gastrointestinal: Positive for nausea and abdominal pain.  Neurological: Negative for headaches.    Blood pressure 136/78, pulse 68, temperature 98.5 F (36.9 C), temperature source Oral, resp. rate 18, weight 234 lb 2.1 oz (106.2 kg), SpO2 99.00%. Physical Exam  Constitutional: He appears well-developed and well-nourished. No distress.  HENT:  Head: Normocephalic and atraumatic.  Eyes: Conjunctivae and EOM are normal.  GI: Soft. He exhibits no mass. There is tenderness (mild in RUQ).  Musculoskeletal: Normal range of motion. He exhibits no edema.  Skin: Skin is warm and dry.     Assessment/Plan RUQ pain with nausea.  Sxs are suggestive of biliary colic but workup is negative.  Plan:  Laparoscopic cholecystectomy with IOC.  Procedure, risks, and aftercare were discussed with him preop. Debera Sterba J 05/04/2011, 2:26 PM

## 2011-05-08 ENCOUNTER — Encounter (HOSPITAL_COMMUNITY): Payer: Self-pay | Admitting: General Surgery

## 2011-05-16 ENCOUNTER — Ambulatory Visit (INDEPENDENT_AMBULATORY_CARE_PROVIDER_SITE_OTHER): Payer: BC Managed Care – PPO | Admitting: General Surgery

## 2011-05-16 ENCOUNTER — Encounter (INDEPENDENT_AMBULATORY_CARE_PROVIDER_SITE_OTHER): Payer: Self-pay | Admitting: General Surgery

## 2011-05-16 VITALS — Ht 78.0 in | Wt 225.4 lb

## 2011-05-16 DIAGNOSIS — K811 Chronic cholecystitis: Secondary | ICD-10-CM

## 2011-05-16 NOTE — Patient Instructions (Signed)
Lowfat diet, life long.  Resume normal activities as tolerated.

## 2011-05-16 NOTE — Progress Notes (Signed)
He is here for a postop visit following laparoscopic cholecystectomy 05/04/11 .  Diet is being tolerated, bowels are moving.  No problems with incisions.  His preop pain has resolved.  PE:  ABD:  Soft, incisions clean/dry/intact and solid.  Assessment:  Doing well postop.  Pathology shows mild chronic cholecystitis.  His preop pain has resolved.  Plan:  Lowfat diet recommended.  Activities as tolerated.  Return visit prn.

## 2011-06-26 ENCOUNTER — Ambulatory Visit (INDEPENDENT_AMBULATORY_CARE_PROVIDER_SITE_OTHER): Payer: BC Managed Care – PPO | Admitting: Medical

## 2011-06-26 ENCOUNTER — Encounter: Payer: Self-pay | Admitting: Medical

## 2011-06-26 VITALS — BP 120/80 | HR 88 | Temp 98.3°F | Resp 18 | Wt 228.0 lb

## 2011-06-26 DIAGNOSIS — R509 Fever, unspecified: Secondary | ICD-10-CM | POA: Insufficient documentation

## 2011-06-26 DIAGNOSIS — H9201 Otalgia, right ear: Secondary | ICD-10-CM | POA: Insufficient documentation

## 2011-06-26 DIAGNOSIS — M542 Cervicalgia: Secondary | ICD-10-CM | POA: Insufficient documentation

## 2011-06-26 DIAGNOSIS — H9209 Otalgia, unspecified ear: Secondary | ICD-10-CM

## 2011-06-26 MED ORDER — CEFTRIAXONE SODIUM 1 G IJ SOLR
1.0000 g | Freq: Once | INTRAMUSCULAR | Status: AC
  Administered 2011-06-26: 1 g via INTRAMUSCULAR

## 2011-06-26 NOTE — Patient Instructions (Signed)
We gave you a shot of Rocephin antibiotic today.  Continue the Amoxicillin 500mg , 2 tablets twice daily until finished.  Consider warm compresses to the right face.  Use 2 Aleve OTC twice daily for pain or extra strength Tylenol every 6 hours for pain.  Use a soft food diet due to the pain, salt water gargles for throat pain, OTC decongestant for ear and sinus congestion given the bloody discharge.   Call/return if worse, fever over 102, or not improving.  Otherwise, recheck in 10-14 days.  Stop smoking.   YOU CAN QUIT SMOKING!  Talk to your medical provider about using medicines to help you quit. These include nicotine replacement gum, lozenges, or skin patches.  Consider calling 1-800-QUIT-NOW, a toll free 24/7 hotline with free counseling to help you quit.  If you are ready to quit smoking or are thinking about it, congratulations! You have chosen to help yourself be healthier and live longer! There are lots of different ways to quit smoking. Nicotine gum, nicotine patches, a nicotine inhaler, or nicotine nasal spray can help with physical craving. Hypnosis, support groups, and medicines help break the habit of smoking. TIPS TO GET OFF AND STAY OFF CIGARETTES  Learn to predict your moods. Do not let a bad situation be your excuse to have a cigarette. Some situations in your life might tempt you to have a cigarette.   Ask friends and co-workers not to smoke around you.   Make your home smoke-free.   Never have "just one" cigarette. It leads to wanting another and another. Remind yourself of your decision to quit.   On a card, make a list of your reasons for not smoking. Read it at least the same number of times a day as you have a cigarette. Tell yourself everyday, "I do not want to smoke. I choose not to smoke."   Ask someone at home or work to help you with your plan to quit smoking.   Have something planned after you eat or have a cup of coffee. Take a walk or get other exercise to  perk you up. This will help to keep you from overeating.   Try a relaxation exercise to calm you down and decrease your stress. Remember, you may be tense and nervous the first two weeks after you quit. This will pass.   Find new activities to keep your hands busy. Play with a pen, coin, or rubber band. Doodle or draw things on paper.   Brush your teeth right after eating. This will help cut down the craving for the taste of tobacco after meals. You can try mouthwash too.   Try gum, breath mints, or diet candy to keep something in your mouth.  IF YOU SMOKE AND WANT TO QUIT:  Do not stock up on cigarettes. Never buy a carton. Wait until one pack is finished before you buy another.   Never carry cigarettes with you at work or at home.   Keep cigarettes as far away from you as possible. Leave them with someone else.   Never carry matches or a lighter with you.   Ask yourself, "Do I need this cigarette or is this just a reflex?"   Bet with someone that you can quit. Put cigarette money in a piggy bank every morning. If you smoke, you give up the money. If you do not smoke, by the end of the week, you keep the money.   Keep trying. It takes 21 days to change  a habit!  Document Released: 03/17/2009 Document Revised: 01/31/2011 Document Reviewed: 03/17/2009 [facility] Patient Information 2012 [place], [place].

## 2011-06-26 NOTE — Progress Notes (Signed)
Subjective: Here today with one-week history of right ear pain, jaw pain, neck pain, feels swollen on the right face.  He went to his dentist thinking this was his wisdom teeth, but this checked out normal. They put him on amoxicillin 500 mg 4 times a day for 10 days, which she just began yesterday.  He has been running fevers up to 101, says it hurts to swallow, his ear canal is sensitive to touch, he has had some postnasal drainage, coughing up some orange/green/blood-tinged sputum.  He has had prior issues with salivary glands being blocked, has used lemon drops for this in the past, but no recent problems with this.  He is using 1000 mg of ibuprofen currently for the pain. He used some leftover Lortab he had for pain. He has felt somewhat sick in general.  He is a smoker, smokes one to 2 cigarettes a day since age 77.  No other aggravating or relieving factors.  Review of systems: Gen.: Positive fever, no chills or sweats, no weight loss Skin: No rash HEENT: No sinus pressure, no sneezing Cardiac: No chest pain or palpitations Lungs: No shortness of breath or wheezing GI: No abdominal pain, nausea, vomiting, diarrhea  Past Medical History  Diagnosis Date  . Colonic polyp   . Abdominal pain   . Nausea   . Difficulty urinating   . Fatigue   . Weakness generalized     aching in joints and weakness after syncope   . GERD (gastroesophageal reflux disease)   . ADD (attention deficit disorder)   . Complication of anesthesia     Pt states the last time he was put to sleep, it took a lots of meds to put him to sleep.     Objective:   Physical Exam  Filed Vitals:   06/26/11 0958  BP: 120/80  Pulse: 88  Temp: 98.3 F (36.8 C)  Resp: 18    General appearance: alert, no distress, WD/WN, white male HEENT: normocephalic, sclerae anicteric, right ear canal slightly swollen and tender, generalized tenderness of right face along preauricular area and right neck,TMs pearly, nares patent, no  discharge or erythema, pharynx with mild erythema  Oral cavity: MMM, no lesions, no obvious tooth decay Neck: supple, tender right-sided lymph nodes, but none greater than a half centimeter, no thyromegaly, no masses Heart: RRR, normal S1, S2, no murmurs Lungs: CTA bilaterally, no wheezes, rhonchi, or rales  Assessment and Plan :    Encounter Diagnoses  Name Primary?  . Otalgia of right ear Yes  . Neck pain, acute   . Fever    Differential includes eustachian tube salpingitis, cellulitis, or other.   Rocephin 1 g IM today, advise he continue amoxicillin 1000 mg twice daily, warm compresses to the face, over-the-counter Mucinex or other decongestant, hydrate well, ibuprofen Tylenol for pain, CBC today.  Call ?return if worse in the meantime, otherwise recheck in 14 days.

## 2011-06-27 LAB — CBC WITH DIFFERENTIAL/PLATELET
Basophils Absolute: 0.3 10*3/uL — ABNORMAL HIGH (ref 0.0–0.1)
Basophils Relative: 3 % — ABNORMAL HIGH (ref 0–1)
Eosinophils Absolute: 0 10*3/uL (ref 0.0–0.7)
MCH: 30.6 pg (ref 26.0–34.0)
MCHC: 34.2 g/dL (ref 30.0–36.0)
Monocytes Relative: 10 % (ref 3–12)
Neutrophils Relative %: 29 % — ABNORMAL LOW (ref 43–77)
Platelets: 164 10*3/uL (ref 150–400)
RDW: 14.2 % (ref 11.5–15.5)

## 2011-06-27 LAB — PATHOLOGIST SMEAR REVIEW

## 2011-08-29 ENCOUNTER — Encounter (INDEPENDENT_AMBULATORY_CARE_PROVIDER_SITE_OTHER): Payer: BC Managed Care – PPO | Admitting: General Surgery

## 2011-09-04 ENCOUNTER — Encounter (INDEPENDENT_AMBULATORY_CARE_PROVIDER_SITE_OTHER): Payer: Self-pay | Admitting: General Surgery

## 2011-12-24 ENCOUNTER — Ambulatory Visit (INDEPENDENT_AMBULATORY_CARE_PROVIDER_SITE_OTHER): Payer: BC Managed Care – PPO | Admitting: Medical

## 2011-12-24 ENCOUNTER — Encounter: Payer: Self-pay | Admitting: Medical

## 2011-12-24 VITALS — BP 130/80 | HR 68 | Temp 98.1°F | Resp 16 | Wt 209.0 lb

## 2011-12-24 DIAGNOSIS — K1120 Sialoadenitis, unspecified: Secondary | ICD-10-CM

## 2011-12-24 DIAGNOSIS — K1121 Acute sialoadenitis: Secondary | ICD-10-CM

## 2011-12-24 MED ORDER — AMOXICILLIN-POT CLAVULANATE 875-125 MG PO TABS: 1.0000 | ORAL_TABLET | Freq: Two times a day (BID) | ORAL | Status: AC

## 2011-12-24 NOTE — Patient Instructions (Signed)
Parotitis  Parotitis is an inflammation of one or both parotid glands. This is the main salivary gland. It lies behind the angle of the jaw and below the ear lobe. The saliva produced comes out of a tiny opening (duct) inside the cheek on either side. It is usually at the level of the upper back teeth. If the parotid gland is swollen, the ear is pushed up and out in a particular way. This helps set this condition apart from a simple lymph gland infection in the same area.  CAUSES   Cases of mumps have mostly disappeared since the start of immunization against mumps. Currently, the most common causes of parotitis are:   Germ (bacterial) infection.   Inflammation of the lymph channels (lymphatics).  Other Uncommon Causes of Parotitis:   Sjogren's syndrome. A condition in which arthritis is associated with a decrease in activity of the glands of the body that produce saliva and tears. Some people are bothered by a dry mouth and intermittent salivary gland enlargement. The diagnosis is made with blood tests or by examination of a piece of tissue from the inside of the lip.   Atypical mycobacteria. Can give rise to a condition that usually infects children. It is a germ similar to tuberculosis. It is often resistant to antibiotic treatment. It may require surgical treatment and removal of the infected salivary gland.   Actinomycosis. An infection of the parotid gland that may also involve the overlying skin. The diagnosis is made by detecting granules of sulphur, produced by the bacteria, on microscopic examination. Treatment is with a prolonged course of penicillin for up to one year.  Acute (Sudden Onset) Bacertial Parotitis  This is a sudden inflammatory response to bacterial infection that causes:   Redness (erythema).   Pain.   Swelling.   Tenderness over the gland on the side of the cheek.   The appearance of pus from the opening of the duct on the inside of the cheek.  It used to be common in dehydrated  and debilitated patients, and often following surgery. It is now more commonly seen after radiotherapy (X-ray treatment) or in patients with a poorly working immune system. Treatment includes:    Correction of the lack of fluids (rehydration).   Medications which kill germs(antibiotics).   Pain relief.  Chronic Recurrent Parotitis  This refers to repeated episodes of discomfort and swelling of the parotid gland. This occurs often after eating. It is caused by decreased flow of saliva. This is often due to either blockage of the duct by a stone or the formation of a duct narrowing. It is treated with:    Gland massage.   Methods to stimulate the flow of saliva (for example, giving lemon juice).   Antibiotics if required.  Surgery to remove the gland is possible. The benefits of surgery need to be balanced against the risk of damage to the facial nerve. The facial nerve allows the muscles of facial expression to function. Damage to this can cause paralysis of one side of the face. X-ray treatment (radiotherapy) and treatment with steroid tablets have been considered. But they are thought to be ineffective.  Viral Parotitis  The most common viral cause of parotitis is mumps. It usually affects 4 to 10 year olds. It causes painful swelling of both parotid glands.  Recurrent Parotitis in Children  This condition is thought to be due to swelling or ballooning of the ducts (ectasia). It results in the same problems(symptoms ) as acute   bacterial parotitis. It is usually caused by bacteria called streptococci that is treated with penicillin. It usually gets well by itself without treatment (self-limiting). Surgery is usually not needed.  Tuberculosis  The parotid glands may become infected with the same bacteria causing tuberculosis (TB). Treatment is with anti-tuberculous antibiotic therapy.  HOME CARE INSTRUCTIONS    Apply ice bags about every 2 hours, for 15 to 20 minutes, while awake, to the sore gland. Place ice  in a plastic bag with a towel around it to prevent frostbite to skin. Continue for 24 hours and then as directed by your caregiver.   Only take over-the-counter or prescription medicines for pain, discomfort, or fever as directed by your caregiver.  SEEK IMMEDIATE MEDICAL CARE IF:    There is increased pain or swelling in your gland that is not controlled with medication.   You have a fever.  Document Released: 11/10/2001 Document Revised: 05/10/2011 Document Reviewed: 04/16/2011  Exit[employer] Patient Information 2012 [employer].

## 2011-12-24 NOTE — Progress Notes (Signed)
  Subjective:   HPI  Luis Walton is a 26 y.o. male who presents for left neck and face swelling.  He has had this numerous times over the years since childhood.  Sometimes left side, sometimes right.   He usually uses sour candies and OTC Ibuprofen which sometimes helps, but sometimes has to come here for treatment.  Current symptoms began 4 days ago with left neck swelling, warmth, tenderness, and he had nausea and some vomiting too.  The vomiting hasn't recurred, but he continues to feel flu like with nausea.  The neck swelling and tenderness is worse.  Using sour candies, lemon juice, and not improving.  Currently has pain from back of left neck to front.  Denies rash, ear pain, throat pain, belly pain, no joint pains, no testicular swelling or pain.  No other aggravating or relieving factors.    No other c/o.  The following portions of the patient's history were reviewed and updated as appropriate: allergies, current medications, past family history, past medical history, past social history, past surgical history and problem list.  Past Medical History  Diagnosis Date  . Colonic polyp   . Abdominal pain   . Nausea   . Difficulty urinating   . Fatigue   . Weakness generalized     aching in joints and weakness after syncope   . GERD (gastroesophageal reflux disease)   . ADD (attention deficit disorder)   . Complication of anesthesia     Pt states the last time he was put to sleep, it took a lots of meds to put him to sleep.    No Known Allergies   Review of Systems ROS reviewed and was negative other than noted in HPI or above.    Objective:   Physical Exam  General appearance: alert, no distress, WD/WN HEENT: normocephalic, sclerae anicteric, TMs pearly, nares patent, no discharge or erythema, pharynx normal Oral cavity: MMM, no lesions Neck: visible left neck and face swelling over the parotid gland, tender in same area as well as warmth.  No induration or erythema.  No  specific mass.   Heart: RRR, normal S1, S2, no murmurs Lungs: CTA bilaterally, no wheezes, rhonchi, or rales Abdomen: +bs, soft, non tender,non distended, no masses, no hepatomegaly, no splenomegaly Pulses: 2+ symmetric  Assessment and Plan :     Encounter Diagnosis  Name Primary?  . Acute parotitis Yes   Exam concerning for bacterial infection, thus, begin Augmentin, c/t sour candies, hydrate well, OTC NSAID for fever and pain and swelling, warm compresses, and if not improving in 2-3 days or worse, then return.   If any lumps or nodules in the neck after 10-14 days, then return.

## 2012-11-25 ENCOUNTER — Ambulatory Visit (INDEPENDENT_AMBULATORY_CARE_PROVIDER_SITE_OTHER): Payer: BC Managed Care – PPO | Admitting: Medical

## 2012-11-25 ENCOUNTER — Encounter: Payer: Self-pay | Admitting: Medical

## 2012-11-25 VITALS — BP 110/78 | HR 92 | Temp 98.1°F | Wt 207.0 lb

## 2012-11-25 DIAGNOSIS — A6920 Lyme disease, unspecified: Secondary | ICD-10-CM

## 2012-11-25 MED ORDER — DOXYCYCLINE HYCLATE 100 MG PO TABS: 100.0000 mg | ORAL_TABLET | Freq: Two times a day (BID) | ORAL | Status: DC

## 2012-11-25 NOTE — Progress Notes (Signed)
Subjective:  Luis Walton is a 27 y.o. male who presents for tick bite.  Gets ticks on him often.  Is in the woods hunting or doing yard work often.  Recently got bit by tick on upper left back 3 weeks ago.  However in the last week had has enlarging red rash on upper back, itchy.  Has some fatigue, some back and muscle aches.  No fever, no NVD, no headache, no chills.   No other aggravating or relieving factors.  No other c/o.  The following portions of the patient's history were reviewed and updated as appropriate: allergies, current medications, past family history, past medical history, past social history, past surgical history and problem list.  ROS Otherwise as in subjective above  Objective: Physical Exam  Vital signs reviewed  General appearance: alert, no distress, WD/WN Skin: left upper back adjacent to midline with 7cm patch of erythema, round target lesion with faint clearing center, and somewhat of a smaller halo or erythema within the larger patch.  No induration, fluctuance.  MSK: nontender Pulses: 2+ radial pulses, 2+ pedal pulses, normal cap refill Ext: no edema   Assessment: Encounter Diagnoses  Name Primary?  . Early localized Lyme infection Yes  . Erythema migrans (Lyme disease)    Plan: Given tick exposure, rash, mild early symptoms, begin Doxycyline for treatment.  Discussed diagnosis, symptoms, and treatment of lyme disease . discussed possible complications.  discussed precautions and prevention of tick exposure.  Discussed risks/benefits of medications.  Answered his questions.  Follow up: 3-4wk, sooner prn

## 2012-11-25 NOTE — Patient Instructions (Signed)
Lyme Disease  You may have been bitten by a tick and are to watch for the development of Lyme Disease. Lyme Disease is an infection that is caused by a bacteria The bacteria causing this disease is named Borreilia burgdorferi. If a tick is infected with this bacteria and then bites you, then Lyme Disease may occur. These ticks are carried by deer and rodents such as rabbits and mice and infest grassy as well as forested areas. Fortunately most tick bites do not cause Lyme Disease.   Lyme Disease is easier to prevent than to treat. First, covering your legs with clothing when walking in areas where ticks are possibly abundant will prevent their attachment because ticks tend to stay within inches of the ground. Second, using insecticides containing DEET can be applied on skin or clothing. Last, because it takes about 12 to 24 hours for the tick to transmit the disease after attachment to the human host, you should inspect your body for ticks twice a day when you are in areas where Lyme Disease is common. You must look thoroughly when searching for ticks. The Ixodes tick that carries Lyme Disease is very small. It is around the size of a sesame seed (picture of tick is not actual size). Removal is best done by grasping the tick by the head and pulling it out. Do not to squeeze the body of the tick. This could inject the infecting bacteria into the bite site. Wash the area of the bite with an antiseptic solution after removal.   Lyme Disease is a disease that may affect many body systems. Because of the small size of the biting tick, most people do not notice being bitten. The first sign of an infection is usually a round red rash that extends out from the center of the tick bite. The center of the lesion may be blood colored (hemorrhagic) or have tiny blisters (vesicular). Most lesions have bright red outer borders and partial central clearing. This rash may extend out many inches in diameter, and multiple lesions may  be present. Other symptoms such as fatigue, headaches, chills and fever, general achiness and swelling of lymph glands may also occur. If this first stage of the disease is left untreated, these symptoms may gradually resolve by themselves, or progressive symptoms may occur because of spread of infection to other areas of the body.   Follow up with your caregiver to have testing and treatment if you have a tick bite and you develop any of the above complaints. Your caregiver may recommend preventative (prophylactic) medications which kill bacteria (antibiotics). Once a diagnosis of Lyme Disease is made, antibiotic treatment is highly likely to cure the disease. Effective treatment of late stage Lyme Disease may require longer courses of antibiotic therapy.   MAKE SURE YOU:   · Understand these instructions.  · Will watch your condition.  · Will get help right away if you are not doing well or get worse.  Document Released: 08/27/2000 Document Revised: 08/13/2011 Document Reviewed: 10/29/2008  ExitCare® Patient Information ©2014 [employer].

## 2013-09-25 ENCOUNTER — Telehealth: Payer: Self-pay | Admitting: Internal Medicine

## 2013-09-25 NOTE — Telephone Encounter (Signed)
[facility] [phone] on April 15th

## 2014-03-09 ENCOUNTER — Other Ambulatory Visit: Payer: Self-pay | Admitting: Gastroenterology

## 2014-03-09 DIAGNOSIS — R1013 Epigastric pain: Secondary | ICD-10-CM

## 2014-03-15 ENCOUNTER — Ambulatory Visit
Admission: RE | Admit: 2014-03-15 | Discharge: 2014-03-15 | Disposition: A | Discharge status: Home / Self Care | Payer: BC Managed Care – PPO | Source: Ambulatory Visit | Attending: Gastroenterology | Admitting: Gastroenterology

## 2014-03-15 DIAGNOSIS — R1013 Epigastric pain: Secondary | ICD-10-CM

## 2014-03-15 MED ORDER — IOHEXOL 300 MG/ML  SOLN
125.0000 mL | Freq: Once | INTRAMUSCULAR | Status: AC | PRN
  Administered 2014-03-15: 125 mL via INTRAVENOUS

## 2016-01-13 ENCOUNTER — Ambulatory Visit (INDEPENDENT_AMBULATORY_CARE_PROVIDER_SITE_OTHER): Payer: BLUE CROSS/BLUE SHIELD | Admitting: Medical

## 2016-01-13 ENCOUNTER — Encounter: Payer: Self-pay | Admitting: Medical

## 2016-01-13 VITALS — BP 122/74 | HR 68 | Wt 225.0 lb

## 2016-01-13 DIAGNOSIS — R109 Unspecified abdominal pain: Secondary | ICD-10-CM | POA: Diagnosis not present

## 2016-01-13 DIAGNOSIS — R197 Diarrhea, unspecified: Secondary | ICD-10-CM

## 2016-01-13 DIAGNOSIS — R195 Other fecal abnormalities: Secondary | ICD-10-CM | POA: Diagnosis not present

## 2016-01-13 DIAGNOSIS — R110 Nausea: Secondary | ICD-10-CM | POA: Diagnosis not present

## 2016-01-13 NOTE — Progress Notes (Signed)
Subjective: Chief Complaint  Patient presents with  . New Patient (Initial Visit)    stomach issues. went to [place] last year and when he came home he bad diarrhea and sharp stabbing stomach pains. when the other symptoms went away the bowel problems continued. may go away for a month and then comes back. gets feelings that he has to use the bathroom but he doesnt have to go   Here as returning patient.   Last visit here few years ago.  He notes for the past several months gets occasional bouts of diarrhea, cramping abdominal pain, sharp abdominal pains, fever, chills.  girlfriend gets the same problem.  He notes that he and girlfriend symptoms began after coming back from trip to [place] this past year.   He has hx/o bad stomach issues and chronic abdominal pain, but these diarrhea episodes are different, relatively new.   Has see GI prior several times, had endoscopy and EGD.   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  No personal or family hx/o IBD.  He does note hx/o colon polyp.    His most recent episode was last week including 5-10 water stools x 3-4 days, felt lightheaded, dizzy, fever, chills, nausea, vomiting.   Denies recent food poisoning, denies any other recent travel, denies recent animal exposure.   He has visited his father in the hospital with leukemia recently.  Gets stool urgency.   In the past has seen Dr. Loreta Ave, GI.  No other aggravating or relieving factors. No other complaint.    Past Medical History:  Diagnosis Date  . Abdominal pain   . ADD (attention deficit disorder)   . Colonic polyp   . Complication of anesthesia    Pt states the last time he was put to sleep, it took a lots of meds to put him to sleep.  Marland Kitchen Difficulty urinating   . Fatigue   . GERD (gastroesophageal reflux disease)   . Nausea   . Weakness generalized    aching in joints and weakness after syncope    Past Surgical History:  Procedure Laterality Date  . CHOLECYSTECTOMY  05/04/2011   Procedure:  LAPAROSCOPIC CHOLECYSTECTOMY WITH INTRAOPERATIVE CHOLANGIOGRAM;  Surgeon: Adolph Pollack, MD;  Location: [facility] OR;  Service: General;  Laterality: N/A;  . UPPER GASTROINTESTINAL ENDOSCOPY     ROS as in subjective  Objective: BP 122/74   Pulse 68   Wt 225 lb (102.1 kg)   BMI 26.00 kg/m   General appearance: alert, no distress, WD/WN, white male HEENT: normocephalic, sclerae anicteric, TMs pearly, nares patent, no discharge or erythema, pharynx normal Oral cavity: MMM, no lesions Neck: supple, no lymphadenopathy, no thyromegaly, no masses Heart: RRR, normal S1, S2, no murmurs Lungs: CTA bilaterally, no wheezes, rhonchi, or rales Abdomen: +bs, soft, generalized abdominal tenderness, otherwise non tender, non distended, no masses, no hepatomegaly, no splenomegaly Pulses: 2+ symmetric, upper and lower extremities, normal cap refill  Assessment: Encounter Diagnoses  Name Primary?  . Abdominal pain, unspecified abdominal location Yes  . Loose stools   . Nausea without vomiting   . Diarrhea, unspecified type     Plan: Discussed symptoms, possible causes, discussed differential which can include infection, H pylori, IBS, diet issues, or other.   Will pursue stool cultures.  Gave samples of IB Gard to try, can also use Imodium prn.  Deandrae was seen today for new patient (initial visit).  Diagnoses and all orders for this visit:  Abdominal pain, unspecified abdominal location -  Stool culture; Future -     Clostridium difficile EIA; Future -     Ova and parasite examination; Future -     Helicobacter pylori antigen det, stool  Loose stools -     Stool culture; Future -     Clostridium difficile EIA; Future -     Ova and parasite examination; Future -     Helicobacter pylori antigen det, stool  Nausea without vomiting  Diarrhea, unspecified type -     Stool culture; Future -     Clostridium difficile EIA; Future -     Ova and parasite examination; Future -      Helicobacter pylori antigen det, stool

## 2016-01-20 ENCOUNTER — Telehealth: Payer: Self-pay

## 2016-01-20 ENCOUNTER — Telehealth: Payer: Self-pay | Admitting: Medical

## 2016-01-20 NOTE — Telephone Encounter (Signed)
[facility]: Stool labs are normal, no h pylori, no C diff, no eggs or parasites, no salmonella, shigella or campylobacter, no yersinia.    At this point see if the samples are helping?  If not, there may be other medications to try for IBS or we can consult back with GI or consider food allergies.  There are labs that can be checked for food allergy  I called and LM with pt to call back

## 2016-01-20 NOTE — Telephone Encounter (Signed)
Stool labs are normal, no h pylori, no C diff, no eggs or parasites, no salmonella, shigella or campylobacter, no yersinia.    At this point see if the samples are helping?  If not, there may be other medications to try for IBS or we can consult back with GI or consider food allergies.  There are labs that can be checked for food allergy.

## 2016-01-20 NOTE — Telephone Encounter (Signed)
Please try and call pt next week

## 2016-01-23 ENCOUNTER — Encounter: Payer: Self-pay | Admitting: Family Medicine

## 2016-01-23 NOTE — Telephone Encounter (Signed)
Left message for pt to call me back 

## 2016-01-23 NOTE — Telephone Encounter (Signed)
Pt was notified of results. Pt states the IBgard seems to help alittle. I advised to stay on the samples and let us know when he finishes if it continues to help or not so we could know what the next steps would be as it was helping so far.

## 2016-02-27 ENCOUNTER — Ambulatory Visit (INDEPENDENT_AMBULATORY_CARE_PROVIDER_SITE_OTHER): Payer: BLUE CROSS/BLUE SHIELD | Admitting: Family Medicine

## 2016-02-27 ENCOUNTER — Encounter: Payer: Self-pay | Admitting: Family Medicine

## 2016-02-27 VITALS — BP 118/74 | HR 68 | Ht 78.0 in | Wt 224.6 lb

## 2016-02-27 DIAGNOSIS — Z23 Encounter for immunization: Secondary | ICD-10-CM

## 2016-02-27 DIAGNOSIS — Z209 Contact with and (suspected) exposure to unspecified communicable disease: Secondary | ICD-10-CM

## 2016-02-27 DIAGNOSIS — R142 Eructation: Secondary | ICD-10-CM | POA: Diagnosis not present

## 2016-02-27 DIAGNOSIS — R143 Flatulence: Secondary | ICD-10-CM

## 2016-02-27 DIAGNOSIS — N509 Disorder of male genital organs, unspecified: Secondary | ICD-10-CM

## 2016-02-27 DIAGNOSIS — R141 Gas pain: Secondary | ICD-10-CM

## 2016-02-27 DIAGNOSIS — N50819 Testicular pain, unspecified: Secondary | ICD-10-CM

## 2016-02-27 DIAGNOSIS — Z0000 Encounter for general adult medical examination without abnormal findings: Secondary | ICD-10-CM

## 2016-02-27 LAB — LIPID PANEL
CHOLESTEROL: 125 mg/dL (ref 125–200)
HDL: 34 mg/dL — ABNORMAL LOW (ref >=40)
LDL Cholesterol: 65 mg/dL (ref <130)
TRIGLYCERIDES: 129 mg/dL (ref <150)
Total CHOL/HDL Ratio: 3.7 Ratio (ref <=5.0)
VLDL: 26 mg/dL (ref <30)

## 2016-02-27 LAB — POCT URINALYSIS DIPSTICK
Bilirubin, UA: NEGATIVE
Blood, UA: NEGATIVE
GLUCOSE UA: NEGATIVE
KETONES UA: NEGATIVE
LEUKOCYTES UA: NEGATIVE
Nitrite, UA: NEGATIVE
PROTEIN UA: NEGATIVE
Spec Grav, UA: 1.01
Urobilinogen, UA: NEGATIVE
pH, UA: 6

## 2016-02-27 LAB — CBC WITH DIFFERENTIAL/PLATELET
BASOS ABS: 89 {cells}/uL (ref 0–200)
Basophils Relative: 1 %
EOS PCT: 4 %
Eosinophils Absolute: 356 cells/uL (ref 15–500)
HCT: 48.5 % (ref 38.5–50.0)
HEMOGLOBIN: 17 g/dL (ref 13.2–17.1)
LYMPHS ABS: 2670 {cells}/uL (ref 850–3900)
Lymphocytes Relative: 30 %
MCH: 30.3 pg (ref 27.0–33.0)
MCHC: 35.1 g/dL (ref 32.0–36.0)
MCV: 86.5 fL (ref 80.0–100.0)
MONO ABS: 534 {cells}/uL (ref 200–950)
MPV: 12 fL (ref 7.5–12.5)
Monocytes Relative: 6 %
NEUTROS ABS: 5251 {cells}/uL (ref 1500–7800)
Neutrophils Relative %: 59 %
Platelets: 170 10*3/uL (ref 140–400)
RBC: 5.61 MIL/uL (ref 4.20–5.80)
RDW: 13.3 % (ref 11.0–15.0)
WBC: 8.9 10*3/uL (ref 4.0–10.5)

## 2016-02-27 LAB — COMPREHENSIVE METABOLIC PANEL
ALBUMIN: 5.1 g/dL (ref 3.6–5.1)
ALK PHOS: 64 U/L (ref 40–115)
ALT: 16 U/L (ref 9–46)
AST: 15 U/L (ref 10–40)
BILIRUBIN TOTAL: 0.6 mg/dL (ref 0.2–1.2)
BUN: 14 mg/dL (ref 7–25)
CO2: 23 mmol/L (ref 20–31)
CREATININE: 0.9 mg/dL (ref 0.60–1.35)
Calcium: 9.7 mg/dL (ref 8.6–10.3)
Chloride: 105 mmol/L (ref 98–110)
Glucose, Bld: 93 mg/dL (ref 65–99)
Potassium: 4 mmol/L (ref 3.5–5.3)
SODIUM: 137 mmol/L (ref 135–146)
TOTAL PROTEIN: 7 g/dL (ref 6.1–8.1)

## 2016-02-27 NOTE — Progress Notes (Signed)
Subjective:    Patient ID: Luis Walton, male    DOB: 1985-07-25, 30 y.o.   MRN: [id number]  HPI He is here for complete examination. He has a long and complicated history of difficulty with eructation as well as flatulence and normal pain. He has had a fairly extensive workup at [facility] and apparently nothing was found. He did however get diagnosed with colonic polyps that were benign and repeat colonoscopy was negative. He also has had a cholecystectomy. He does complain of a several year history of left testicular pain. There is bone no history of injury. No discharge or dysuria or other urinary symptoms. He is sexually active and does not use condoms. He does smoke marijuana on a regular basis usually after work. He is not interested in quitting. He does not smoke cigarettes and does not drink. He would like STD testing to be safe. His work is going well. He has had no chest pain, nausea, vomiting, allergy type symptoms He has no other concerns or complaints. Family and social history as well as health maintenance and immunizations were reviewed.   Review of Systems  All other systems reviewed and are negative.      Objective:   Physical Exam BP 118/74   Pulse 68   Ht 6\' 6"  (1.981 m)   Wt 224 lb 9.6 oz (101.9 kg)   BMI 25.96 kg/m   General Appearance:    Alert, cooperative, no distress, appears stated age  Head:    Normocephalic, without obvious abnormality, atraumatic  Eyes:    PERRL, conjunctiva/corneas clear, EOM's intact, fundi    benign  Ears:    Normal TM's and external ear canals  Nose:   Nares normal, mucosa normal, no drainage or sinus   tenderness  Throat:   Lips, mucosa, and tongue normal; teeth and gums normal  Neck:   Supple, no lymphadenopathy;  thyroid:  no   enlargement/tenderness/nodules; no carotid   bruit or JVD  Back:    Spine nontender, no curvature, ROM normal, no CVA     tenderness  Lungs:     Clear to auscultation bilaterally without wheezes,  rales or     ronchi; respirations unlabored  Chest Wall:    No tenderness or deformity   Heart:    Regular rate and rhythm, S1 and S2 normal, no murmur, rub   or gallop  Breast Exam:    No chest wall tenderness, masses or gynecomastia  Abdomen:     Soft, non-tender, nondistended, normoactive bowel sounds,    no masses, no hepatosplenomegaly  Genitalia:    Normal male external genitalia without lesions.  Testicles without masses.  No inguinal hernias.He did complain of pain on palpation of the left testes   Rectal:   Deferred due to age <40 and lack of symptoms  Extremities:   No clubbing, cyanosis or edema  Pulses:   2+ and symmetric all extremities  Skin:   Skin color, texture, turgor normal, no rashes or lesions  Lymph nodes:   Cervical, supraclavicular, and axillary nodes normal  Neurologic:   CNII-XII intact, normal strength, sensation and gait; reflexes 2+ and symmetric throughout          Psych:   Normal mood, affect, hygiene and grooming.          Assessment & Plan:  Routine general medical examination at a health care facility - Plan: POCT Urinalysis Dipstick, CBC with Differential/Platelet, Comprehensive metabolic panel, Lipid panel  Contact with  or exposure to communicable disease - Plan: HIV antibody, RPR, GC/chlamydia probe amp, urine  Flatulence, eructation and gas pain  Need for prophylactic vaccination with combined diphtheria-tetanus-pertussis (DTP) vaccine - Plan: Tdap vaccine greater than or equal to 7yo IM  Persistent testicular pain - Plan: Ambulatory referral to Urology Discussed the use of marijuana with him however at this point he is not interested in stopping. Also strongly encouraged him to use condoms.

## 2016-02-28 LAB — HIV ANTIBODY (ROUTINE TESTING W REFLEX): HIV 1&2 Ab, 4th Generation: NONREACTIVE

## 2016-02-28 LAB — RPR

## 2016-03-07 ENCOUNTER — Telehealth: Payer: Self-pay

## 2016-03-07 NOTE — Telephone Encounter (Signed)
GC/C was not collected on 09/25. Would you like me to call pt to have him return to get this?

## 2016-03-07 NOTE — Telephone Encounter (Signed)
:   Know that it was unfortunately not collected for the gonorrhea chlamydia and if he wants a test that he can come by and get it

## 2016-03-08 ENCOUNTER — Other Ambulatory Visit: Payer: Self-pay

## 2016-03-08 DIAGNOSIS — Z202 Contact with and (suspected) exposure to infections with a predominantly sexual mode of transmission: Secondary | ICD-10-CM

## 2016-03-08 NOTE — Telephone Encounter (Signed)
Left message for pt to call me back 

## 2016-03-08 NOTE — Telephone Encounter (Signed)
Pt informed of labs and will come by and leave urine Monday 03/12/16

## 2016-03-12 ENCOUNTER — Other Ambulatory Visit: Payer: BLUE CROSS/BLUE SHIELD

## 2016-03-12 DIAGNOSIS — Z202 Contact with and (suspected) exposure to infections with a predominantly sexual mode of transmission: Secondary | ICD-10-CM

## 2016-03-13 LAB — GC/CHLAMYDIA PROBE AMP
CT Probe RNA: NOT DETECTED
GC Probe RNA: NOT DETECTED

## 2016-07-02 ENCOUNTER — Ambulatory Visit
Admission: RE | Admit: 2016-07-02 | Discharge: 2016-07-02 | Disposition: A | Discharge status: Home / Self Care | Payer: BLUE CROSS/BLUE SHIELD | Source: Ambulatory Visit | Attending: Medical | Admitting: Medical

## 2016-07-02 ENCOUNTER — Encounter: Payer: Self-pay | Admitting: Medical

## 2016-07-02 ENCOUNTER — Ambulatory Visit (INDEPENDENT_AMBULATORY_CARE_PROVIDER_SITE_OTHER): Payer: BLUE CROSS/BLUE SHIELD | Admitting: Medical

## 2016-07-02 VITALS — BP 136/90 | HR 69 | Temp 98.4°F | Wt 233.0 lb

## 2016-07-02 DIAGNOSIS — R05 Cough: Secondary | ICD-10-CM

## 2016-07-02 DIAGNOSIS — R0602 Shortness of breath: Secondary | ICD-10-CM

## 2016-07-02 DIAGNOSIS — R059 Cough, unspecified: Secondary | ICD-10-CM

## 2016-07-02 MED ORDER — MOMETASONE FURO-FORMOTEROL FUM 100-5 MCG/ACT IN AERO: 2.0000 | INHALATION_SPRAY | Freq: Two times a day (BID) | RESPIRATORY_TRACT | 0 refills | Status: DC

## 2016-07-02 NOTE — Progress Notes (Signed)
Subjective:  Luis Walton is a 31 y.o. male who presents for cough.  Has had a deep cough in right chest for a week.  No fever, no "flu or cold symptoms, " but really deep cough,  Has been getting winded recently.  Has been coughing up dark brownish mucous.   Has had some sick contacts.  He is a marijuana smoker but not cigarettes.   There is a lot of dust in work environment, is a Production designer, theatre/television/filmfork lift operator.   No other recent exposures.  No recent travel.  [place].  No other aggravating or relieving factors.  No other c/o.  The following portions of the patient's history were reviewed and updated as appropriate: allergies, current medications, past family history, past medical history, past social history, past surgical history and problem list.  ROS as in subjective  Past Medical History:  Diagnosis Date  . Abdominal pain   . ADD (attention deficit disorder)   . Colonic polyp   . Complication of anesthesia    Pt states the last time he was put to sleep, it took a lots of meds to put him to sleep.  Marland Kitchen. Difficulty urinating   . Fatigue   . GERD (gastroesophageal reflux disease)   . Nausea   . Weakness generalized    aching in joints and weakness after syncope    No current outpatient prescriptions on file prior to visit.   No current facility-administered medications on file prior to visit.    ROS as in subjective     Objective: BP 136/90   Pulse 69   Temp 98.4 F (36.9 C)   Wt 233 lb (105.7 kg)   SpO2 96%   BMI 26.93 kg/m   General appearance: Alert, WD/WN, no distress                             Skin: warm, no rash, no diaphoresis                           Head: no sinus tenderness                            Eyes: conjunctiva normal, corneas clear, PERRLA                            Ears: pearly TMs, external ear canals normal                          Nose: septum midline, turbinates swollen, with erythema and dry mucoid discharge         Mouth/throat: MMM, tongue normal, mild pharyngeal erythema                           Neck: supple, no adenopathy, no thyromegaly, nontender                          Heart: RRR, normal S1, S2, no murmurs                         Lungs: clear, no wheezes, no rales  Extremities: no edema, nontender      Assessment: Encounter Diagnoses  Name Primary?  . Cough Yes  . SOB (shortness of breath)      Plan:  Medication orders today include:  Sample given for [facility], discussed risks/benefits of medication, proper use of medication  He will go for xray to rule out pneumothorax and other given his symptoms are mainly SOB and cough without significant congestion or illness symptom in a tall young male.   In general, advised rest, hydration, mucinex Dm and use the sample Dulera.  F/u pending xray.   [name] was seen today for coughing.  Diagnoses and all orders for this visit:  Cough -     DG Chest 2 View; Future  SOB (shortness of breath) -     DG Chest 2 View; Future  Other orders -     mometasone-formoterol (DULERA) 100-5 MCG/ACT AERO; Inhale 2 puffs into the lungs 2 (two) times daily.

## 2016-07-02 NOTE — Patient Instructions (Signed)
Recommendations:  Drink plenty of water throughout the day  Use the [facility] sample, 2 puffs twice daily.  Rinse your mouth out with water  Consider using Mucinex DM OTC for cough/congestion in chest  Go for xray.  We are trying to rule out pneumonemia and pneumothorax  Call if worse or not improving within a week  You can probably stop Dulera within a week if much improved      Pneumothorax Introduction A pneumothorax, commonly called a collapsed lung, is a condition in which air leaks from a lung and builds up in the space between the lung and the chest wall (pleural space). The air in a pneumothorax is trapped outside the lung and takes up space, preventing the lung from fully expanding. This is a condition that usually occurs suddenly. The buildup of air may be small or large. A small pneumothorax may go away on its own. When a pneumothorax is larger, it will often require medical treatment and hospitalization. What are the causes? A pneumothorax can sometimes happen quickly with no apparent cause. People with underlying lung problems, particularly COPD or emphysema, are at higher risk of pneumothorax. However, pneumothorax can happen quickly even in people with no prior known lung problems. Trauma, surgery, medical procedures, or injury to the chest wall can also cause a pneumothorax. What are the signs or symptoms? Sometimes a pneumothorax will have no symptoms. When symptoms are present, they can include:  Chest pain.  Shortness of breath.  Increased rate of breathing.  Bluish color to your lips or skin (cyanosis). How is this diagnosed? Pneumothorax is usually diagnosed by a chest X-ray or chest CT scan. Your health care provider will also take a medical history and perform a physical exam to determine why you may have a pneumothorax. How is this treated? A small pneumothorax may go away on its own without treatment. Extra oxygen can sometimes help a small pneumothorax go  away more quickly. For a larger pneumothorax or a pneumothorax that is causing symptoms, a procedure is usually needed to drain the air.In some cases, the health care provider may drain the air using a needle. In other cases, a chest tube may be inserted into the pleural space. A chest tube is a small tube placed between the ribs and into the pleural space. This removes the extra air and allows the lung to expand back to its normal size. A large pneumothorax will usually require a hospital stay. If there is ongoing air leakage into the pleural space, then the chest tube may need to remain in place for several days until the air leak has healed. In some cases, surgery may be needed. Follow these instructions at home:  Only take over-the-counter or prescription medicines as directed by your health care provider.  If a cough or pain makes it difficult for you to sleep at night, try sleeping in a semi-upright position in a recliner or by using 2 or 3 pillows.  Rest and limit activity as directed by your health care provider.  If you had a chest tube and it was removed, ask your health care provider when it is okay to remove the dressing. Until your health care provider says you can remove the dressing, do not allow it to get wet.  Do not smoke. Smoking is a risk factor for pneumothorax.  Do not fly in an airplane or scuba dive until your health care provider says it is okay.  Follow up with your health care provider  as directed. Get help right away if:  You have increasing chest pain or shortness of breath.  You have a cough that is not controlled with suppressants.  You begin coughing up blood.  You have pain that is getting worse or is not controlled with medicines.  You cough up thick, discolored mucus (sputum) that is yellow to green in color.  You have redness, increasing pain, or discharge at the site where a chest tube had been in place (if your pneumothorax was treated with a chest  tube).  The site where your chest tube was located opens up.  You feel air coming out of the site where the chest tube was placed.  You have a fever or persistent symptoms for more than 2-3 days.  You have a fever and your symptoms suddenly get worse. This information is not intended to replace advice given to you by your health care provider. Make sure you discuss any questions you have with your health care provider. Document Released: 05/21/2005 Document Revised: 10/27/2015 Document Reviewed: 10/14/2013  2017 Elsevier

## 2017-06-21 ENCOUNTER — Encounter: Payer: Self-pay | Admitting: Medical

## 2017-06-21 ENCOUNTER — Ambulatory Visit: Payer: BLUE CROSS/BLUE SHIELD | Admitting: Medical

## 2017-06-21 VITALS — BP 126/82 | HR 80 | Wt 236.0 lb

## 2017-06-21 DIAGNOSIS — R140 Abdominal distension (gaseous): Secondary | ICD-10-CM

## 2017-06-21 DIAGNOSIS — R142 Eructation: Secondary | ICD-10-CM

## 2017-06-21 DIAGNOSIS — R109 Unspecified abdominal pain: Secondary | ICD-10-CM | POA: Diagnosis not present

## 2017-06-21 DIAGNOSIS — Z9049 Acquired absence of other specified parts of digestive tract: Secondary | ICD-10-CM | POA: Diagnosis not present

## 2017-06-21 DIAGNOSIS — G8929 Other chronic pain: Secondary | ICD-10-CM | POA: Diagnosis not present

## 2017-06-21 LAB — CBC WITH DIFFERENTIAL/PLATELET
Basophils Absolute: 0.1 10*3/uL (ref 0.0–0.2)
Basos: 1 %
EOS (ABSOLUTE): 0.2 10*3/uL (ref 0.0–0.4)
Eos: 2 %
HEMOGLOBIN: 17 g/dL (ref 13.0–17.7)
Hematocrit: 48.2 % (ref 37.5–51.0)
IMMATURE GRANS (ABS): 0 10*3/uL (ref 0.0–0.1)
Immature Granulocytes: 0 %
LYMPHS: 30 %
Lymphocytes Absolute: 2.4 10*3/uL (ref 0.7–3.1)
MCH: 30.6 pg (ref 26.6–33.0)
MCHC: 35.3 g/dL (ref 31.5–35.7)
MCV: 87 fL (ref 79–97)
MONOCYTES: 6 %
Monocytes Absolute: 0.4 10*3/uL (ref 0.1–0.9)
NEUTROS ABS: 5 10*3/uL (ref 1.4–7.0)
Neutrophils: 61 %
PLATELETS: 160 10*3/uL (ref 150–379)
RBC: 5.55 x10E6/uL (ref 4.14–5.80)
RDW: 13.1 % (ref 12.3–15.4)
WBC: 8 10*3/uL (ref 3.4–10.8)

## 2017-06-21 LAB — COMPREHENSIVE METABOLIC PANEL
ALT: 28 IU/L (ref 0–44)
AST: 19 IU/L (ref 0–40)
Albumin/Globulin Ratio: 2.7 — ABNORMAL HIGH (ref 1.2–2.2)
Albumin: 5.1 g/dL (ref 3.5–5.5)
Alkaline Phosphatase: 72 IU/L (ref 39–117)
BILIRUBIN TOTAL: 0.6 mg/dL (ref 0.0–1.2)
BUN/Creatinine Ratio: 16 (ref 9–20)
BUN: 14 mg/dL (ref 6–20)
CHLORIDE: 103 mmol/L (ref 96–106)
CO2: 25 mmol/L (ref 20–29)
CREATININE: 0.9 mg/dL (ref 0.76–1.27)
Calcium: 10.1 mg/dL (ref 8.7–10.2)
GFR calc non Af Amer: 115 mL/min/{1.73_m2} (ref >59)
GFR, EST AFRICAN AMERICAN: 133 mL/min/{1.73_m2} (ref >59)
GLUCOSE: 86 mg/dL (ref 65–99)
Globulin, Total: 1.9 g/dL (ref 1.5–4.5)
Potassium: 4.4 mmol/L (ref 3.5–5.2)
Sodium: 143 mmol/L (ref 134–144)
TOTAL PROTEIN: 7 g/dL (ref 6.0–8.5)

## 2017-06-21 NOTE — Progress Notes (Signed)
Subjective: Chief Complaint  Patient presents with  . Abdominal Pain   Here accompanied by wife.   Luis Walton is here to discuss his chronic abdominal pain.  Luis Walton has a history of right upper quadrant abdominal and back pain for about 9 years.  In the past Luis Walton has seen GI and had evaluation prior.  At one point in the past his g since his gallbladder surgery Luis Walton continues to get all bladder was highly infected and covered in scar tissue.   Had gall bladder removed, and has since had diagnosis of rapid gastric emptying, daily chronic abdominal pain mostly right upper quadrant, constant bloating, frequent belching throughout the day.  His belly pain sometimes wake him of his sleep  When belching, feels sensation go up nose and down chest.   Feels swollen in right upper abdomen often.   Gets bloated often.   BMs vary with food intake.  Has 5-7 BM daily.  Has mixture of stool character.  Sometimes loose, sometimes formed stool.  Sometimes his bloating or abdominal discomfort interferes with breathing.     Luis Walton reports prior allergy testing that was negative.  Luis Walton is worried Luis Walton has a liver tumor  Luis Walton has had prior endoscopies and colonoscopies Dr. Lavonia Drafts  Luis Walton is not sure if this is related but when Luis Walton was 32 year old Luis Walton fell off a carnival ride 15 feet off the ground landing on his right side   Past Medical History:  Diagnosis Date  . Abdominal pain   . ADD (attention deficit disorder)   . Colonic polyp   . Complication of anesthesia    Pt states the last time Luis Walton was put to sleep, it took a lots of meds to put him to sleep.  Marland Kitchen Difficulty urinating   . Fatigue   . GERD (gastroesophageal reflux disease)   . Nausea   . Weakness generalized    aching in joints and weakness after syncope    Current Outpatient Medications on File Prior to Visit  Medication Sig Dispense Refill  . mometasone-formoterol (DULERA) 100-5 MCG/ACT AERO Inhale 2 puffs into the lungs 2 (two) times daily. (Patient not taking: Reported  on 06/21/2017) 1 Inhaler 0   No current facility-administered medications on file prior to visit.    ROS as in subjective   Objective BP 126/82 (BP Location: Right Arm, Patient Position: Sitting)   Pulse 80   Wt 236 lb (107 kg)   SpO2 98%   BMI 27.27 kg/m   General appearance: alert, no distress, WD/WN,  Oral cavity: MMM, no lesions Neck: supple, no lymphadenopathy, no thyromegaly, no masses Heart: RRR, normal S1, S2, no murmurs Lungs: CTA bilaterally, no wheezes, rhonchi, or rales Abdomen: +bs, soft, mild epigastric and RUQ tenderness, otherwise non tender, non distended, no masses, no hepatomegaly, no splenomegaly Back: nontender Pulses: 2+ symmetric, upper and lower extremities, normal cap refill Ext: no edema    Assessment: Encounter Diagnoses  Name Primary?  . Chronic abdominal pain Yes  . S/P cholecystectomy   . Belching   . Bloating       Plan: We discussed his concerns and symptoms.  Luis Walton will sign so we can get prior allergy testing lab results.    I reviewed his October 2015 CT entero abdomen and pelvis that was normal I reviewed his 06/2016 chest x-ray I reviewed 06/2008 EGD was normal  We will update some labs, consider repeat CT scan of abdomen  We discussed using an elimination diet and tracking food diary.  Advised Luis Walton use a probiotic and a multivitamin  Luis Walton was seen today for abdominal pain.  Diagnoses and all orders for this visit:  Chronic abdominal pain -     CBC with Differential/Platelet -     Comprehensive metabolic panel  S/P cholecystectomy -     CBC with Differential/Platelet -     Comprehensive metabolic panel  Belching -     CBC with Differential/Platelet -     Comprehensive metabolic panel  Bloating -     CBC with Differential/Platelet -     Comprehensive metabolic panel

## 2017-07-01 ENCOUNTER — Ambulatory Visit: Payer: BLUE CROSS/BLUE SHIELD | Admitting: Family Medicine

## 2017-07-01 ENCOUNTER — Encounter: Payer: Self-pay | Admitting: Family Medicine

## 2017-07-01 VITALS — BP 126/86 | HR 101 | Temp 98.1°F | Wt 230.4 lb

## 2017-07-01 DIAGNOSIS — J111 Influenza due to unidentified influenza virus with other respiratory manifestations: Secondary | ICD-10-CM | POA: Diagnosis not present

## 2017-07-01 LAB — POC INFLUENZA A&B (BINAX/QUICKVUE)
INFLUENZA A, POC: NEGATIVE
INFLUENZA B, POC: NEGATIVE

## 2017-07-01 NOTE — Addendum Note (Signed)
Addended by: Renelda LomaHENRY, Kataleena Holsapple on: 07/01/2017 02:00 PM   Modules accepted: Orders

## 2017-07-01 NOTE — Progress Notes (Signed)
   Subjective:    Patient ID: Luis Walton, male    DOB: May 25, 1986, 32 y.o.   MRN: [id number]  HPI 4 days ago he noted the onset of malaise followed by fever, chills, vomiting, coughing.  He states today he is feeling slightly better and only a slight cough.   Review of Systems     Objective:   Physical Exam Alert and in no distress. Tympanic membranes and canals are normal. Pharyngeal area is normal. Neck is supple without adenopathy or thyromegaly. Cardiac exam shows a regular sinus rhythm without murmurs or gallops. Lungs show slight expiratory rhonchi. Flu test is negative        Assessment & Plan:  Flu syndrome I explained that even of the flu test was negative his symptoms are most consistent with that and no further intervention needed at this point.  He was comfortable with that.

## 2020-11-04 ENCOUNTER — Encounter (HOSPITAL_BASED_OUTPATIENT_CLINIC_OR_DEPARTMENT_OTHER): Payer: Self-pay

## 2020-11-04 ENCOUNTER — Other Ambulatory Visit: Payer: Self-pay

## 2020-11-04 ENCOUNTER — Emergency Department (HOSPITAL_BASED_OUTPATIENT_CLINIC_OR_DEPARTMENT_OTHER)
Admission: EM | Admit: 2020-11-04 | Discharge: 2020-11-04 | Disposition: A | Discharge status: Home / Self Care | Payer: BLUE CROSS/BLUE SHIELD | Attending: Emergency Medicine | Admitting: Emergency Medicine

## 2020-11-04 DIAGNOSIS — L237 Allergic contact dermatitis due to plants, except food: Secondary | ICD-10-CM | POA: Insufficient documentation

## 2020-11-04 MED ORDER — TRIAMCINOLONE ACETONIDE 0.1 % EX CREA: 1.0000 "application " | TOPICAL_CREAM | Freq: Two times a day (BID) | CUTANEOUS | 0 refills | Status: AC

## 2020-11-04 MED ORDER — PREDNISONE 10 MG PO TABS: ORAL_TABLET | ORAL | 0 refills | Status: AC

## 2020-11-04 NOTE — ED Triage Notes (Signed)
Pt stated has had poison ivy for three days and is here because the swelling is continuing and it has now spread down to his legs. Pt denies SHOB.

## 2020-11-04 NOTE — ED Provider Notes (Addendum)
[facility] EMERGENCY DEPT Provider Note   CSN: [id number] Arrival date & time: 11/04/20  1802     History No chief complaint on file.   Luis Walton is a 35 y.o. male.  The history is provided by the patient.  Rash Location:  Full body Quality: blistering, itchiness and redness   Severity:  Severe Onset quality:  Sudden Duration:  4 days Timing:  Constant Progression:  Worsening Chronicity:  New Context: plant contact   Relieved by:  Nothing Worsened by:  Nothing Ineffective treatments:  Topical steroids and antihistamines Associated symptoms: no abdominal pain, no fever, no joint pain, no shortness of breath, no sore throat, no throat swelling, no tongue swelling, not vomiting and not wheezing        Past Medical History:  Diagnosis Date  . Abdominal pain   . ADD (attention deficit disorder)   . Colonic polyp   . Complication of anesthesia    Pt states the last time he was put to sleep, it took a lots of meds to put him to sleep.  Marland Kitchen Difficulty urinating   . Fatigue   . GERD (gastroesophageal reflux disease)   . Nausea   . Weakness generalized    aching in joints and weakness after syncope     Patient Active Problem List   Diagnosis Date Noted  . Chronic abdominal pain 06/21/2017  . Otalgia of right ear 06/26/2011  . Neck pain, acute 06/26/2011  . Fever 06/26/2011  . 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 CHOLECYSTECTOMY  05/04/2011   Procedure: LAPAROSCOPIC CHOLECYSTECTOMY WITH INTRAOPERATIVE CHOLANGIOGRAM;  Surgeon: Adolph Pollack, MD;  Location: [facility] OR;  Service: General;  Laterality: N/A;  . CHOLECYSTECTOMY  2012  . UPPER GASTROINTESTINAL ENDOSCOPY         Family History  Problem Relation Age of Onset  . Asthma Brother   . Asthma Maternal Uncle   . Asthma Paternal Aunt   . Heart disease Paternal Grandmother   . Heart disease Paternal Grandfather     Social History    Tobacco Use  . Smoking status: Never Smoker  . Smokeless tobacco: Never Used  Substance Use Topics  . Alcohol use: No  . Drug use: Yes    Frequency: 7.0 times per week    Types: Marijuana    Home Medications Prior to Admission medications   Medication Sig Start Date End Date Taking? Authorizing Provider  predniSONE (DELTASONE) 10 MG tablet Take 6 tablets (60 mg total) by mouth daily for 7 days, THEN 4 tablets (40 mg total) daily for 7 days, THEN 2 tablets (20 mg total) daily for 7 days. 11/04/20 11/25/20 Yes Koleen Distance, MD  triamcinolone cream (KENALOG) 0.1 % Apply 1 application topically 2 (two) times daily for 21 days. 11/04/20 11/25/20 Yes Koleen Distance, MD  mometasone-formoterol (DULERA) 100-5 MCG/ACT AERO Inhale 2 puffs into the lungs 2 (two) times daily. Patient not taking: Reported on 06/21/2017 07/02/16 11/04/20  Tysinger, Kermit Balo, PA-C    Allergies    Patient has no known allergies.  Review of Systems   Review of Systems  Constitutional: Negative for chills and fever.  HENT: Negative for ear pain and sore throat.   Eyes: Negative for pain and visual disturbance.  Respiratory: Negative for cough, shortness of breath and wheezing.   Cardiovascular: Negative for chest pain and palpitations.  Gastrointestinal: Negative for abdominal pain and vomiting.  Genitourinary: Negative for  dysuria and hematuria.  Musculoskeletal: Negative for arthralgias and back pain.  Skin: Positive for rash. Negative for color change.  Neurological: Negative for seizures and syncope.  All other systems reviewed and are negative.   Physical Exam Updated Vital Signs BP 126/85 (BP Location: Right Arm)   Pulse 79   Temp 98.5 F (36.9 C) (Oral)   Resp 16   Ht 6\' 4"  (1.93 m)   Wt 104.3 kg   SpO2 99%   BMI 28.00 kg/m   Physical Exam Vitals and nursing note reviewed.  Constitutional:      Appearance: Normal appearance.  HENT:     Head: Normocephalic and atraumatic.  Eyes:      Conjunctiva/sclera: Conjunctivae normal.  Pulmonary:     Effort: Pulmonary effort is normal. No respiratory distress.  Musculoskeletal:        General: No deformity. Normal range of motion.     Cervical back: Normal range of motion.  Skin:    General: Skin is warm and dry.     Comments: He has a rash consistent with poison ivy dermatitis on his arms and legs.  He has large swath of erythema with tiny vesicles.  Neurological:     General: No focal deficit present.     Mental Status: He is alert and oriented to person, place, and time. Mental status is at baseline.  Psychiatric:        Mood and Affect: Mood normal.     ED Results / Procedures / Treatments   Labs (all labs ordered are listed, but only abnormal results are displayed) Labs Reviewed - No data to display  EKG None  Radiology No results found.  Procedures Procedures   Medications Ordered in ED Medications - No data to display  ED Course  I have reviewed the triage vital signs and the nursing notes.  Pertinent labs & imaging results that were available during my care of the patient were reviewed by me and considered in my medical decision making (see chart for details).    MDM Rules/Calculators/A&P                          presents with poison ivy.  No evidence of secondary infection.  He was counseled on topical and systemic treatment.  He has taken steroids in the past and has done well with them.  Nonetheless, we did speak about side effects.  I also counseled him on the need for a prolonged course of steroids.  He was advised on wound care and given return precautions should he develop signs or symptoms of a secondary infection. Final Clinical Impression(s) / ED Diagnoses Final diagnoses:  Poison ivy dermatitis    Rx / DC Orders ED Discharge Orders         Ordered    predniSONE (DELTASONE) 10 MG tablet        11/04/20 1851    triamcinolone cream (KENALOG) 0.1 %  2 times daily         11/04/20 1851           01/04/21, MD 11/04/20 01/04/21    Vinnie Langton, MD 11/04/20 [phone]
